# Patient Record
Sex: Female | Born: 1990 | Race: Black or African American | Hispanic: No | Marital: Single | Smoking: Former smoker
Health system: Southern US, Community
[De-identification: ages and names within clinical notes are randomized; demographics above are authoritative.]

## PROBLEM LIST (undated history)

## (undated) ENCOUNTER — Inpatient Hospital Stay (HOSPITAL_COMMUNITY): Payer: Self-pay

## (undated) DIAGNOSIS — Z789 Other specified health status: Secondary | ICD-10-CM

## (undated) DIAGNOSIS — B9689 Other specified bacterial agents as the cause of diseases classified elsewhere: Secondary | ICD-10-CM

## (undated) DIAGNOSIS — N760 Acute vaginitis: Secondary | ICD-10-CM

## (undated) HISTORY — PX: NO PAST SURGERIES: SHX2092

---

## 2010-12-11 ENCOUNTER — Emergency Department (HOSPITAL_COMMUNITY)
Admission: EM | Admit: 2010-12-11 | Discharge: 2010-12-12 | Disposition: A | Discharge status: Home / Self Care | Payer: Medicaid Other | Attending: Emergency Medicine | Admitting: Emergency Medicine

## 2010-12-11 DIAGNOSIS — A499 Bacterial infection, unspecified: Secondary | ICD-10-CM | POA: Insufficient documentation

## 2010-12-11 DIAGNOSIS — K5900 Constipation, unspecified: Secondary | ICD-10-CM | POA: Insufficient documentation

## 2010-12-11 DIAGNOSIS — N760 Acute vaginitis: Secondary | ICD-10-CM | POA: Insufficient documentation

## 2010-12-11 DIAGNOSIS — B9689 Other specified bacterial agents as the cause of diseases classified elsewhere: Secondary | ICD-10-CM | POA: Insufficient documentation

## 2010-12-11 DIAGNOSIS — R110 Nausea: Secondary | ICD-10-CM | POA: Insufficient documentation

## 2010-12-12 LAB — URINALYSIS, ROUTINE W REFLEX MICROSCOPIC
Nitrite: NEGATIVE
Protein, ur: NEGATIVE mg/dL
Specific Gravity, Urine: 1.015 (ref 1.005–1.030)
Urobilinogen, UA: 1 mg/dL (ref 0.0–1.0)

## 2010-12-12 LAB — PREGNANCY, URINE: Preg Test, Ur: NEGATIVE

## 2010-12-12 LAB — WET PREP, GENITAL: Trich, Wet Prep: NONE SEEN

## 2010-12-13 LAB — GC/CHLAMYDIA PROBE AMP, GENITAL: GC Probe Amp, Genital: NEGATIVE

## 2012-10-24 NOTE — L&D Delivery Note (Signed)
Delivery Note At 3:31 AM a viable female was delivered via Vaginal, Spontaneous Delivery (Presentation: Right Occiput Anterior).  APGAR: 6, 9; weight pending.   Placenta status: Intact, Spontaneous.  Cord: 3 vessels with the following complications: None.  Cord pH: Not drawn.  Anesthesia: Epidural  Episiotomy: None Lacerations: 1st degree;Vaginal Suture Repair: 3.0 Monocryl, one anchor and one running stitch Est. Blood Loss (mL):   Mom to postpartum.  Baby to Couplet care / Skin to Skin.  Foley bulb reported out around 0230 and RN checked pt to be around 5.5 cm dilated at that time. Dr. Casper Harrison checked patient around 0315, cervix 9.5 / 100 / +1 with plan to labor down for about 45 minutes and return. Called back to room in approximately 5 minutes for continued pressure and nurse check with baby starting to crown. Mother pushed through approximately 4 contractions and delivered easily over perineum with 1 small 1st degree tear requiring one stitch as above with good hemostasis. Baby alert with vigorous cry. Mother planning to breastfeed and wants Mirena.  Roney Marion was present for and assisted with the delivery.  Bobbye Morton, MD PGY-2, [facility] 09/16/2013, 3:55 AM

## 2012-10-24 NOTE — L&D Delivery Note (Signed)
I examined pt and agree with documentation above and resident plan of care. MUHAMMAD,WALIDAH  

## 2012-11-11 ENCOUNTER — Inpatient Hospital Stay (HOSPITAL_COMMUNITY)
Admission: AD | Admit: 2012-11-11 | Discharge: 2012-11-11 | Disposition: A | Discharge status: Home / Self Care | Payer: Self-pay | Source: Ambulatory Visit | Attending: Obstetrics and Gynecology | Admitting: Obstetrics and Gynecology

## 2012-11-11 ENCOUNTER — Encounter (HOSPITAL_COMMUNITY): Payer: Self-pay

## 2012-11-11 DIAGNOSIS — B9689 Other specified bacterial agents as the cause of diseases classified elsewhere: Secondary | ICD-10-CM | POA: Insufficient documentation

## 2012-11-11 DIAGNOSIS — N949 Unspecified condition associated with female genital organs and menstrual cycle: Secondary | ICD-10-CM | POA: Insufficient documentation

## 2012-11-11 DIAGNOSIS — N760 Acute vaginitis: Secondary | ICD-10-CM | POA: Insufficient documentation

## 2012-11-11 DIAGNOSIS — A499 Bacterial infection, unspecified: Secondary | ICD-10-CM | POA: Insufficient documentation

## 2012-11-11 DIAGNOSIS — R1031 Right lower quadrant pain: Secondary | ICD-10-CM | POA: Insufficient documentation

## 2012-11-11 HISTORY — DX: Other specified health status: Z78.9

## 2012-11-11 LAB — URINALYSIS, ROUTINE W REFLEX MICROSCOPIC
Bilirubin Urine: NEGATIVE
Glucose, UA: NEGATIVE mg/dL
Hgb urine dipstick: NEGATIVE
Protein, ur: NEGATIVE mg/dL
Urobilinogen, UA: 0.2 mg/dL (ref 0.0–1.0)

## 2012-11-11 LAB — WET PREP, GENITAL: Yeast Wet Prep HPF POC: NONE SEEN

## 2012-11-11 LAB — POCT PREGNANCY, URINE: Preg Test, Ur: NEGATIVE

## 2012-11-11 MED ORDER — METRONIDAZOLE 500 MG PO TABS: 500.0000 mg | ORAL_TABLET | Freq: Two times a day (BID) | ORAL | Status: DC

## 2012-11-11 NOTE — MAU Provider Note (Signed)
  History     CSN: [id number]  Arrival date and time: 11/11/12 3474   First Provider Initiated Contact with Patient 11/11/12 2010      Chief Complaint  Patient presents with  . Vaginal Discharge  . Dysuria   HPI Ms. Rebecca Leach is a 22 y.o. .G0P0 female who presents to MAU with complaint of vaginal discharge. The patient states that she has had a milky white discharge x 3 weeks. She has had some intermittent pain that at its worst she rates at 5/10. Today she is not having any pain. When it is present it is in the RLQ. The patient is currently sexually active with one partner, a new partner. She does not use condoms or any type of birth control. The patient denies fever, N/V/D.   OB History    Grav Para Term Preterm Abortions TAB SAB Ect Mult Living   0 0              Past Medical History  Diagnosis Date  . No pertinent past medical history     Past Surgical History  Procedure Date  . No past surgeries     History reviewed. No pertinent family history.  History  Substance Use Topics  . Smoking status: Current Every Day Smoker  . Smokeless tobacco: Not on file  . Alcohol Use: Yes    Allergies:  Allergies  Allergen Reactions  . Other     mushrooms    Prescriptions prior to admission  Medication Sig Dispense Refill  . Ascorbic Acid (VITAMIN C) 100 MG tablet Take 100 mg by mouth daily.      . cholecalciferol (VITAMIN D) 1000 UNITS tablet Take 1,000 Units by mouth daily.        ROS All negative unless otherwise noted in HPI Physical Exam   Blood pressure 135/78, pulse 77, temperature 98.8 F (37.1 C), temperature source Oral, resp. rate 16, last menstrual period 10/17/2012, SpO2 100.00%.  Physical Exam  Constitutional: She is oriented to person, place, and time. She appears well-developed and well-nourished. No distress.  HENT:  Head: Normocephalic and atraumatic.  Cardiovascular: Normal rate, regular rhythm and normal heart sounds.   Respiratory:  Effort normal and breath sounds normal. No respiratory distress.  GI: Soft. Bowel sounds are normal. She exhibits no distension and no mass. There is no tenderness. There is no rebound and no guarding.  Genitourinary: Uterus normal. Right adnexum displays no mass and no tenderness. Left adnexum displays no mass and no tenderness. No erythema, tenderness or bleeding around the vagina. Vaginal discharge (thin white discharge noted on speculum exam) found.  Neurological: She is alert and oriented to person, place, and time.  Skin: Skin is warm and dry. No erythema.  Psychiatric: She has a normal mood and affect.    MAU Course  Procedures None  MDM UA, Wet prep and GC/Chlamydia performed  Assessment and Plan  A: Bacterial vaginosis  P: Discharge home Rx for Flagyl given to patient Discussed appropriate feminine hygiene and probiotics Recommended patient call [facility] to establish GYN care and start birth control Recommended patient use condoms Patient may follow-up in MAU as needed  Freddi Starr, PA-C 11/11/2012, 8:21 PM

## 2012-11-11 NOTE — MAU Note (Signed)
RLQ pain with urination. Vaginal discharge, white, malodorous. Denies vaginal bleeding. LMP 10/17/12.

## 2012-11-12 LAB — GC/CHLAMYDIA PROBE AMP: CT Probe RNA: NEGATIVE

## 2012-11-12 NOTE — MAU Provider Note (Signed)
Attestation of Attending Supervision of Advanced Practitioner: Evaluation and management procedures were performed by the PA/NP/CNM/OB Fellow under my supervision/collaboration. Chart reviewed and agree with management and plan.  Haskel Dewalt V 11/12/2012 10:23 PM

## 2013-01-22 ENCOUNTER — Emergency Department (HOSPITAL_COMMUNITY)
Admission: EM | Admit: 2013-01-22 | Discharge: 2013-01-23 | Disposition: A | Discharge status: Home / Self Care | Payer: Self-pay | Attending: Emergency Medicine | Admitting: Emergency Medicine

## 2013-01-22 ENCOUNTER — Emergency Department (HOSPITAL_COMMUNITY): Payer: Self-pay

## 2013-01-22 ENCOUNTER — Encounter (HOSPITAL_COMMUNITY): Payer: Self-pay

## 2013-01-22 DIAGNOSIS — O9989 Other specified diseases and conditions complicating pregnancy, childbirth and the puerperium: Secondary | ICD-10-CM | POA: Insufficient documentation

## 2013-01-22 DIAGNOSIS — O99330 Smoking (tobacco) complicating pregnancy, unspecified trimester: Secondary | ICD-10-CM | POA: Insufficient documentation

## 2013-01-22 DIAGNOSIS — N949 Unspecified condition associated with female genital organs and menstrual cycle: Secondary | ICD-10-CM | POA: Insufficient documentation

## 2013-01-22 DIAGNOSIS — Z8742 Personal history of other diseases of the female genital tract: Secondary | ICD-10-CM | POA: Insufficient documentation

## 2013-01-22 DIAGNOSIS — Z3201 Encounter for pregnancy test, result positive: Secondary | ICD-10-CM | POA: Insufficient documentation

## 2013-01-22 DIAGNOSIS — R1031 Right lower quadrant pain: Secondary | ICD-10-CM | POA: Insufficient documentation

## 2013-01-22 HISTORY — DX: Other specified bacterial agents as the cause of diseases classified elsewhere: B96.89

## 2013-01-22 HISTORY — DX: Acute vaginitis: N76.0

## 2013-01-22 LAB — POCT I-STAT, CHEM 8
Calcium, Ion: 1.2 mmol/L (ref 1.12–1.23)
Creatinine, Ser: 0.6 mg/dL (ref 0.50–1.10)
Glucose, Bld: 87 mg/dL (ref 70–99)
HCT: 38 % (ref 36.0–46.0)
Hemoglobin: 12.9 g/dL (ref 12.0–15.0)
Potassium: 3.7 mEq/L (ref 3.5–5.1)
TCO2: 25 mmol/L (ref 0–100)

## 2013-01-22 LAB — CBC
HCT: 34.9 % — ABNORMAL LOW (ref 36.0–46.0)
Hemoglobin: 12.2 g/dL (ref 12.0–15.0)
MCH: 30.7 pg (ref 26.0–34.0)
MCHC: 35 g/dL (ref 30.0–36.0)
RDW: 11.7 % (ref 11.5–15.5)

## 2013-01-22 LAB — HCG, QUANTITATIVE, PREGNANCY: hCG, Beta Chain, Quant, S: 1013 m[IU]/mL — ABNORMAL HIGH (ref <5)

## 2013-01-22 MED ORDER — SODIUM CHLORIDE 0.9 % IV BOLUS (SEPSIS)
250.0000 mL | Freq: Once | INTRAVENOUS | Status: AC
  Administered 2013-01-22: 250 mL via INTRAVENOUS

## 2013-01-22 NOTE — ED Notes (Signed)
Per pt, tested positive at home Sunday for pregnancy.  Last period was at end of Feb.  Pt now states now having rt lower abdomen, constant.  No n/v.  No difficulty urinating. No fever.  No physical trauma or lifting injury noted.

## 2013-01-22 NOTE — ED Notes (Signed)
US at bedside

## 2013-01-22 NOTE — ED Provider Notes (Signed)
History     CSN: [id number]  Arrival date & time 01/22/13  2035   First MD Initiated Contact with Patient 01/22/13 2247      Chief Complaint  Patient presents with  . Abdominal Pain    (Consider location/radiation/quality/duration/timing/severity/associated sxs/prior treatment) HPI Comments: Patient with + home pregnancy test LMP 12/18/12  Now having RLQ pain .  Denies vaginal bleeding, discharge, dysuria   Patient is a 22 y.o. female presenting with abdominal pain. The history is provided by the patient.  Abdominal Pain Pain location:  RLQ Pain quality: cramping   Pain radiates to:  Does not radiate Pain severity:  Moderate Onset quality:  Gradual Duration:  1 day Timing:  Constant Progression:  Worsening Chronicity:  New Relieved by:  Nothing Worsened by:  Nothing tried Associated symptoms: no dysuria, no fever, no nausea, no vaginal bleeding, no vaginal discharge and no vomiting     Past Medical History  Diagnosis Date  . No pertinent past medical history   . Bacterial vaginitis     Past Surgical History  Procedure Laterality Date  . No past surgeries      No family history on file.  History  Substance Use Topics  . Smoking status: Current Every Day Smoker  . Smokeless tobacco: Not on file  . Alcohol Use: Yes     Comment: social    OB History   Grav Para Term Preterm Abortions TAB SAB Ect Mult Living   0 0              Review of Systems  Constitutional: Negative for fever.  Gastrointestinal: Positive for abdominal pain. Negative for nausea and vomiting.  Genitourinary: Positive for pelvic pain. Negative for dysuria, decreased urine volume, vaginal bleeding, vaginal discharge and vaginal pain.  All other systems reviewed and are negative.    Allergies  Other  Home Medications   Current Outpatient Rx  Name  Route  Sig  Dispense  Refill  . ibuprofen (ADVIL,MOTRIN) 200 MG tablet   Oral   Take 200 mg by mouth every 6 (six) hours as needed for  pain.           BP 104/65  Pulse 69  Temp(Src) 98 F (36.7 C) (Oral)  Resp 18  SpO2 97%  LMP 12/16/2012  Physical Exam  Nursing note and vitals reviewed. Constitutional: She appears well-developed and well-nourished.  HENT:  Head: Normocephalic.  Eyes: Pupils are equal, round, and reactive to light.  Neck: Normal range of motion.  Cardiovascular: Normal rate.   Pulmonary/Chest: Effort normal.  Musculoskeletal: Normal range of motion.  Neurological: She is alert.  Skin: Skin is warm.    ED Course  Procedures (including critical care time)  Labs Reviewed  HCG, QUANTITATIVE, PREGNANCY - Abnormal; Notable for the following:    hCG, Beta Chain, Quant, S 1013 (*)    All other components within normal limits  CBC - Abnormal; Notable for the following:    HCT 34.9 (*)    All other components within normal limits  POCT PREGNANCY, URINE - Abnormal; Notable for the following:    Preg Test, Ur POSITIVE (*)    All other components within normal limits  WET PREP, GENITAL  GC/CHLAMYDIA PROBE AMP  POCT I-STAT, CHEM 8   US Ob Comp Less 14 Wks  01/23/2013  *RADIOLOGY REPORT*  Clinical Data: Right lower quadrant pain  OBSTETRIC <14 WK [place] AND TRANSVAGINAL OB US  Technique:  Both transabdominal and transvaginal ultrasound examinations  were performed for complete evaluation of the gestation as well as the maternal uterus, adnexal regions, and pelvic cul-de-sac.  Transvaginal technique was performed to assess early pregnancy.  Comparison:  None.  Intrauterine gestational sac:  There is a small irregular endometrial fluid collection with a mean sac diameter of 3.9 millimeters.  It is difficult to determine if this is a true gestational sac or just endometrial fluid. Yolk sac: Absent. Embryo: Absent. Cardiac Activity: Not applicable Heart Rate: Not applicable the bpm  MSD: 3.9 mm  five w one d CRL:   mm   w   d             [place] EDC: 09/23/2013  Maternal uterus/adnexae: There is a cystic lesion  within the right ovary measuring 1.2 x 1.1 x 1.6 cm with some internal echogenic signal and is likely a benign finding such as a hemorrhagic cyst.  Within the left adnexa, there is a large 6.1 x 3.8 x 3.7 cm heterogeneous mass associated with the left ovary.  Trace free fluid is noted.  IMPRESSION: A small endometrial fluid collection is present however this cannot be confirmed as a normal gestational sac.  It is somewhat irregular in shape and there is no obvious decidual ring.  There is also noted to be a large complex and vascular mass within the left adnexa.  These findings raise the possibility of ectopic pregnancy but there is no ectopic visualized gestational sac, no ectopic embryo, or node tubal ring.  Differential diagnosis still includes early pregnancy, spontaneous abortion, and ectopic pregnancy.  If the left adnexal mass is not associated with decidual tissue, it may represent a large ovarian mass such as a dermoid tumor.  Serial beta HCG levels are recommended.  Follow-up ultrasound in 1 week is also recommended to ensure development of an embryo.   Original Report Authenticated By: Jolaine Click, M.D.    US Ob Transvaginal  01/23/2013  *RADIOLOGY REPORT*  Clinical Data: Right lower quadrant pain  OBSTETRIC <14 WK [place] AND TRANSVAGINAL OB US  Technique:  Both transabdominal and transvaginal ultrasound examinations were performed for complete evaluation of the gestation as well as the maternal uterus, adnexal regions, and pelvic cul-de-sac.  Transvaginal technique was performed to assess early pregnancy.  Comparison:  None.  Intrauterine gestational sac:  There is a small irregular endometrial fluid collection with a mean sac diameter of 3.9 millimeters.  It is difficult to determine if this is a true gestational sac or just endometrial fluid. Yolk sac: Absent. Embryo: Absent. Cardiac Activity: Not applicable Heart Rate: Not applicable the bpm  MSD: 3.9 mm  five w one d CRL:   mm   w   d             [place]  EDC: 09/23/2013  Maternal uterus/adnexae: There is a cystic lesion within the right ovary measuring 1.2 x 1.1 x 1.6 cm with some internal echogenic signal and is likely a benign finding such as a hemorrhagic cyst.  Within the left adnexa, there is a large 6.1 x 3.8 x 3.7 cm heterogeneous mass associated with the left ovary.  Trace free fluid is noted.  IMPRESSION: A small endometrial fluid collection is present however this cannot be confirmed as a normal gestational sac.  It is somewhat irregular in shape and there is no obvious decidual ring.  There is also noted to be a large complex and vascular mass within the left adnexa.  These findings raise the possibility of  ectopic pregnancy but there is no ectopic visualized gestational sac, no ectopic embryo, or node tubal ring.  Differential diagnosis still includes early pregnancy, spontaneous abortion, and ectopic pregnancy.  If the left adnexal mass is not associated with decidual tissue, it may represent a large ovarian mass such as a dermoid tumor.  Serial beta HCG levels are recommended.  Follow-up ultrasound in 1 week is also recommended to ensure development of an embryo.   Original Report Authenticated By: Jolaine Click, M.D.      1. Pregnancy with abdominal pain of right lower quadrant, antepartum       MDM   Ultrasound is inconclusive as to intrauterine pregnancy versus ovarian cyst.  Quantitative beta was drawn in his low 1003.  This will need to be redrawn in 2 days.  Recommended.  The patient.  Followup at [facility] hospital        Arman Filter, NP 01/23/13 [phone]

## 2013-01-23 LAB — GC/CHLAMYDIA PROBE AMP: GC Probe RNA: NEGATIVE

## 2013-01-23 MED ORDER — CEFTRIAXONE SODIUM 250 MG IJ SOLR
250.0000 mg | Freq: Once | INTRAMUSCULAR | Status: AC
  Administered 2013-01-23: 250 mg via INTRAMUSCULAR
  Filled 2013-01-23: qty 250

## 2013-01-23 MED ORDER — AZITHROMYCIN 250 MG PO TABS
1000.0000 mg | ORAL_TABLET | Freq: Once | ORAL | Status: AC
  Administered 2013-01-23: 1000 mg via ORAL
  Filled 2013-01-23: qty 4

## 2013-01-24 ENCOUNTER — Inpatient Hospital Stay (HOSPITAL_COMMUNITY): Payer: Self-pay

## 2013-01-24 ENCOUNTER — Encounter (HOSPITAL_COMMUNITY): Payer: Self-pay

## 2013-01-24 ENCOUNTER — Inpatient Hospital Stay (HOSPITAL_COMMUNITY)
Admission: AD | Admit: 2013-01-24 | Discharge: 2013-01-24 | Disposition: A | Discharge status: Home / Self Care | Payer: Self-pay | Source: Ambulatory Visit | Attending: Obstetrics & Gynecology | Admitting: Obstetrics & Gynecology

## 2013-01-24 DIAGNOSIS — D279 Benign neoplasm of unspecified ovary: Secondary | ICD-10-CM | POA: Insufficient documentation

## 2013-01-24 DIAGNOSIS — O26899 Other specified pregnancy related conditions, unspecified trimester: Secondary | ICD-10-CM

## 2013-01-24 DIAGNOSIS — D271 Benign neoplasm of left ovary: Secondary | ICD-10-CM

## 2013-01-24 DIAGNOSIS — R109 Unspecified abdominal pain: Secondary | ICD-10-CM | POA: Insufficient documentation

## 2013-01-24 DIAGNOSIS — O99891 Other specified diseases and conditions complicating pregnancy: Secondary | ICD-10-CM | POA: Insufficient documentation

## 2013-01-24 DIAGNOSIS — O34599 Maternal care for other abnormalities of gravid uterus, unspecified trimester: Secondary | ICD-10-CM | POA: Insufficient documentation

## 2013-01-24 LAB — HCG, QUANTITATIVE, PREGNANCY: hCG, Beta Chain, Quant, S: 2033 m[IU]/mL — ABNORMAL HIGH (ref <5)

## 2013-01-24 NOTE — MAU Provider Note (Signed)
Attestation of Attending Supervision of Advanced Practitioner (CNM/NP): Evaluation and management procedures were performed by the Advanced Practitioner under my supervision and collaboration.  I have reviewed the Advanced Practitioner's note and chart, and I agree with the management and plan.  HARRAWAY-SMITH, Xareni Kelch 8:48 PM     

## 2013-01-24 NOTE — MAU Note (Signed)
Patient states she was seen at [facility] ED and informed to return to MAU today for follow up BHCG. Patient states she has intermittent right lower abdominal pain about every 2 hours. Some nausea, no vomiting.  No bleeding.

## 2013-01-24 NOTE — MAU Provider Note (Signed)
  History     CSN: [id number]  Arrival date and time: 01/24/13 1320   None     Chief Complaint  Patient presents with  . Follow-up   HPI Rebecca Leach is 22 y.o. G1P0 [redacted]w[redacted]d weeks presents for follow up BHCG.  She was seen at [facility] on 4/1 late in the evening with pain in early pregnancy.  BHCG at that time was 1013.  U/S showed an irregular sac and a cystic mass on the ovary  Today she reports intermittent pain on the lower right occuring q2hrs.  Denies vaginal bleeding.    Past Medical History  Diagnosis Date  . No pertinent past medical history   . Bacterial vaginitis     Past Surgical History  Procedure Laterality Date  . No past surgeries      No family history on file.  History  Substance Use Topics  . Smoking status: Current Every Day Smoker  . Smokeless tobacco: Not on file  . Alcohol Use: Yes     Comment: social    Allergies:  Allergies  Allergen Reactions  . Other Hives    mushrooms    Prescriptions prior to admission  Medication Sig Dispense Refill  . ibuprofen (ADVIL,MOTRIN) 200 MG tablet Take 200 mg by mouth every 6 (six) hours as needed for pain.        ROS Physical Exam   Blood pressure 112/59, pulse 73, resp. rate 16, height 5\' 8"  (1.727 m), weight 143 lb (64.864 kg), last menstrual period 12/16/2012, SpO2 100.00%.  Physical Exam    Results for orders placed during the hospital encounter of 01/24/13 (from the past 24 hour(s))  HCG, QUANTITATIVE, PREGNANCY     Status: Abnormal   Collection Time    01/24/13  1:53 PM      Result Value Range   hCG, Beta Chain, Quant, S 2033 (*) <5 mIU/mL    MAU Course  Procedures  MDM 16:15  Reported MSE, previous visit and BHCG results to Dr. Erin Fulling.   Order given for repeat ultrasound with dopplers. 17;25  Reported ultrasound results to Dr. Erin Fulling.  Have patient re-evaluated in 1 week--in the clinic.  Instructed the patient if they are unable to see her in that time frame to return to  MAU for re-evaluation.  ALSO IF she has increase in pain or vaginal bleeding to return to MAU before that date for re-evaluation.  Discussed ectopic pregnancy sxs with the patient. Assessment and Plan  A:  Follow up BHCG--doubled      Probably IUGS no YS or FP     Right sided pain-intermittent      Probably dermoid cyst on the left  P: The gyn clinic will call patient for appt for next week     Patient understands if unable to get appt, she is to return to MAU for follow up of abdominal pain     Return sooner for increase in sxs.    Darrell Hauk,EVE M 01/24/2013, 4:12 PM

## 2013-01-24 NOTE — ED Provider Notes (Signed)
Medical screening examination/treatment/procedure(s) were performed by non-physician practitioner and as supervising physician I was immediately available for consultation/collaboration.  Patient will need follow up for possible ectopic or threatened miscarriage. She does have some RLQ pain but suspsious finding on [place] is on the left. IUP not confirmed but Quants are low but over 1000. Stable for discharge and close follow up.  Shelda Jakes, MD 01/24/13 [phone]

## 2013-03-12 ENCOUNTER — Inpatient Hospital Stay (HOSPITAL_COMMUNITY): Payer: Medicaid Other

## 2013-03-12 ENCOUNTER — Encounter (HOSPITAL_COMMUNITY): Payer: Self-pay

## 2013-03-12 ENCOUNTER — Inpatient Hospital Stay (HOSPITAL_COMMUNITY)
Admission: AD | Admit: 2013-03-12 | Discharge: 2013-03-12 | Disposition: A | Discharge status: Home Health | Payer: Medicaid Other | Source: Ambulatory Visit | Attending: Obstetrics and Gynecology | Admitting: Obstetrics and Gynecology

## 2013-03-12 DIAGNOSIS — O26859 Spotting complicating pregnancy, unspecified trimester: Secondary | ICD-10-CM | POA: Insufficient documentation

## 2013-03-12 DIAGNOSIS — R109 Unspecified abdominal pain: Secondary | ICD-10-CM | POA: Insufficient documentation

## 2013-03-12 DIAGNOSIS — O209 Hemorrhage in early pregnancy, unspecified: Secondary | ICD-10-CM

## 2013-03-12 DIAGNOSIS — O4690 Antepartum hemorrhage, unspecified, unspecified trimester: Secondary | ICD-10-CM

## 2013-03-12 LAB — CBC
Hemoglobin: 11.2 g/dL — ABNORMAL LOW (ref 12.0–15.0)
MCH: 30.8 pg (ref 26.0–34.0)
RBC: 3.64 MIL/uL — ABNORMAL LOW (ref 3.87–5.11)

## 2013-03-12 LAB — URINALYSIS, ROUTINE W REFLEX MICROSCOPIC
Glucose, UA: NEGATIVE mg/dL
Ketones, ur: NEGATIVE mg/dL
Leukocytes, UA: NEGATIVE
pH: 7.5 (ref 5.0–8.0)

## 2013-03-12 LAB — URINE MICROSCOPIC-ADD ON

## 2013-03-12 LAB — HCG, QUANTITATIVE, PREGNANCY: hCG, Beta Chain, Quant, S: 41202 m[IU]/mL — ABNORMAL HIGH (ref <5)

## 2013-03-12 LAB — ABO/RH: ABO/RH(D): O POS

## 2013-03-12 NOTE — MAU Note (Signed)
Pt states began spotting today around 1500, now having lower abd and back cramping. Drinks 10 glasses of water daily. Denies uti s/s. White odorous discharge, thin.

## 2013-03-12 NOTE — MAU Provider Note (Signed)
  History     CSN: [id number]  Arrival date and time: 03/12/13 1603   None     Chief Complaint  Patient presents with  . Abdominal Cramping  . Spotting    HPI Pt is G1P0 presenting with spotting in pregnancy.  Pt was seen initially on 02/23/2013 and had [place] with IUGS [redacted]w[redacted]d which would make her 7w 1d. by previous U/S - by LMP should be [redacted]w[redacted]d. Pt states that she had IC last night and started spotting a little dark brown discharge today.  Pt is not having cramping or UTI symptoms.  Past Medical History  Diagnosis Date  . No pertinent past medical history   . Bacterial vaginitis     Past Surgical History  Procedure Laterality Date  . No past surgeries      No family history on file.  History  Substance Use Topics  . Smoking status: Current Every Day Smoker  . Smokeless tobacco: Not on file  . Alcohol Use: Yes     Comment: social    Allergies:  Allergies  Allergen Reactions  . Other Hives    mushrooms    No prescriptions prior to admission    Review of Systems  Gastrointestinal: Negative for nausea, vomiting, abdominal pain, diarrhea and constipation.  Genitourinary: Negative for dysuria and urgency.   Physical Exam   Last menstrual period 12/16/2012.  Physical Exam  Vitals reviewed. Constitutional: She is oriented to person, place, and time. She appears well-developed and well-nourished.  HENT:  Head: Normocephalic.  Eyes: Pupils are equal, round, and reactive to light.  Neck: Normal range of motion.  Cardiovascular: Normal rate.   Respiratory: Effort normal.  GI: Soft. She exhibits no distension. There is no tenderness. There is no rebound.  Genitourinary:  Small amount of beige colored creamy discharge in vault; cervix deep in corner of fornix unable to visualize with speculum and difficult to palpate.  Uterus enlarged 12 week size, NT; adnexa without palpable enlargement or tenderness  Musculoskeletal: Normal range of motion.  Neurological: She is alert  and oriented to person, place, and time.  Skin: Skin is warm and dry.  Psychiatric: She has a normal mood and affect.    MAU Course  Procedures   Assessment and Plan  Bleeding in pregnancy Single living IUP F/u for OB care-pt desires to go to [facility] clinic  [facility] 03/12/2013, 5:24 PM

## 2013-03-12 NOTE — MAU Provider Note (Signed)
Chart reviewed and agree with management and plan.  

## 2013-04-02 ENCOUNTER — Encounter (HOSPITAL_COMMUNITY): Payer: Self-pay | Admitting: *Deleted

## 2013-04-02 ENCOUNTER — Inpatient Hospital Stay (HOSPITAL_COMMUNITY)
Admission: AD | Admit: 2013-04-02 | Discharge: 2013-04-02 | Disposition: A | Discharge status: Home / Self Care | Payer: Medicaid Other | Source: Ambulatory Visit | Attending: Obstetrics & Gynecology | Admitting: Obstetrics & Gynecology

## 2013-04-02 DIAGNOSIS — O9989 Other specified diseases and conditions complicating pregnancy, childbirth and the puerperium: Secondary | ICD-10-CM

## 2013-04-02 DIAGNOSIS — O26899 Other specified pregnancy related conditions, unspecified trimester: Secondary | ICD-10-CM

## 2013-04-02 DIAGNOSIS — O99891 Other specified diseases and conditions complicating pregnancy: Secondary | ICD-10-CM | POA: Insufficient documentation

## 2013-04-02 DIAGNOSIS — N949 Unspecified condition associated with female genital organs and menstrual cycle: Secondary | ICD-10-CM

## 2013-04-02 DIAGNOSIS — K5900 Constipation, unspecified: Secondary | ICD-10-CM | POA: Insufficient documentation

## 2013-04-02 DIAGNOSIS — R1031 Right lower quadrant pain: Secondary | ICD-10-CM | POA: Insufficient documentation

## 2013-04-02 LAB — URINALYSIS, ROUTINE W REFLEX MICROSCOPIC
Ketones, ur: NEGATIVE mg/dL
Leukocytes, UA: NEGATIVE
Nitrite: NEGATIVE
Protein, ur: NEGATIVE mg/dL

## 2013-04-02 NOTE — MAU Provider Note (Signed)
History     CSN: [id number]  Arrival date and time: 04/02/13 1436   First Provider Initiated Contact with Patient 04/02/13 1550      Chief Complaint  Patient presents with  . Abdominal Pain   HPI 22 y.o. G1P0 at [redacted]w[redacted]d with RLQ pain starting at about 1230 today. Sharp and quick, lasts for a few seconds. No bleeding or discharge. + constipation. No n/v/d, no fever.   Past Medical History  Diagnosis Date  . No pertinent past medical history   . Bacterial vaginitis     Past Surgical History  Procedure Laterality Date  . No past surgeries      History reviewed. No pertinent family history.  History  Substance Use Topics  . Smoking status: Former Smoker -- 0.25 packs/day    Quit date: 12/31/2012  . Smokeless tobacco: Not on file  . Alcohol Use: No     Comment: social    Allergies:  Allergies  Allergen Reactions  . Other Hives    mushrooms    No prescriptions prior to admission    Review of Systems  Constitutional: Negative.   Respiratory: Negative.   Cardiovascular: Negative.   Gastrointestinal: Positive for abdominal pain and constipation. Negative for nausea, vomiting and diarrhea.  Genitourinary: Negative for dysuria, urgency, frequency, hematuria and flank pain.       Negative for vaginal bleeding, vaginal discharge, dyspareunia  Musculoskeletal: Negative.   Neurological: Negative.   Psychiatric/Behavioral: Negative.    Physical Exam   Blood pressure 106/63, pulse 82, temperature 97.6 F (36.4 C), temperature source Oral, resp. rate 16, height 5\' 7"  (1.702 m), weight 148 lb 12.8 oz (67.495 kg), last menstrual period 12/16/2012, SpO2 100.00%.  Physical Exam  Nursing note and vitals reviewed. Constitutional: She is oriented to person, place, and time. She appears well-developed and well-nourished. No distress.  Cardiovascular: Normal rate.   Respiratory: Effort normal.  GI: Soft. She exhibits no distension (c/w dates) and no mass. There is tenderness  (RLQ/inguinal area, mild). There is no rebound and no guarding.  Musculoskeletal: Normal range of motion.  Neurological: She is alert and oriented to person, place, and time.  Skin: Skin is warm and dry.  Psychiatric: She has a normal mood and affect.    MAU Course  Procedures Results for orders placed during the hospital encounter of 04/02/13 (from the past 24 hour(s))  URINALYSIS, ROUTINE W REFLEX MICROSCOPIC     Status: None   Collection Time    04/02/13  3:05 PM      Result Value Range   Color, Urine YELLOW  YELLOW   APPearance CLEAR  CLEAR   Specific Gravity, Urine 1.020  1.005 - 1.030   pH 6.0  5.0 - 8.0   Glucose, UA NEGATIVE  NEGATIVE mg/dL   Hgb urine dipstick NEGATIVE  NEGATIVE   Bilirubin Urine NEGATIVE  NEGATIVE   Ketones, ur NEGATIVE  NEGATIVE mg/dL   Protein, ur NEGATIVE  NEGATIVE mg/dL   Urobilinogen, UA 0.2  0.0 - 1.0 mg/dL   Nitrite NEGATIVE  NEGATIVE   Leukocytes, UA NEGATIVE  NEGATIVE     Assessment and Plan   1. Pain of round ligament complicating pregnancy, antepartum   Rev'd comfort measures Pt c/o constipation since starting PNV with iron - recommend PNV with no iron, stool softener PRN or miralax Has appointment next week in [facility]    Medication List    ASK your doctor about these medications       prenatal  multivitamin Tabs  Take 1 tablet by mouth daily at 12 noon.            Follow-up Information   Follow up with [facility]   Contact information:   [address] [place] [address] [phone]        Rebecca Leach 04/02/2013, 4:11 PM

## 2013-04-02 NOTE — MAU Provider Note (Signed)
Attestation of Attending Supervision of Advanced Practitioner (PA/CNM/NP): Evaluation and management procedures were performed by the Advanced Practitioner under my supervision and collaboration.  I have reviewed the Advanced Practitioner's note and chart, and I agree with the management and plan.  Analuisa Tudor, MD, FACOG Attending Obstetrician & Gynecologist Faculty Practice, [facility]  

## 2013-04-02 NOTE — MAU Note (Signed)
Rt lower abd pain which started today about 1230, sharp in nature.  Pain radiating to rt lower back area.  Denies vaginal bleeding or gush of fluid.

## 2013-04-12 ENCOUNTER — Encounter (HOSPITAL_COMMUNITY): Payer: Self-pay | Admitting: *Deleted

## 2013-04-12 ENCOUNTER — Inpatient Hospital Stay (HOSPITAL_COMMUNITY)
Admission: AD | Admit: 2013-04-12 | Discharge: 2013-04-12 | Disposition: A | Discharge status: Home / Self Care | Payer: Medicaid Other | Source: Ambulatory Visit | Attending: Obstetrics & Gynecology | Admitting: Obstetrics & Gynecology

## 2013-04-12 DIAGNOSIS — O228X9 Other venous complications in pregnancy, unspecified trimester: Secondary | ICD-10-CM | POA: Insufficient documentation

## 2013-04-12 DIAGNOSIS — K649 Unspecified hemorrhoids: Secondary | ICD-10-CM | POA: Insufficient documentation

## 2013-04-12 DIAGNOSIS — K5900 Constipation, unspecified: Secondary | ICD-10-CM | POA: Insufficient documentation

## 2013-04-12 DIAGNOSIS — O99891 Other specified diseases and conditions complicating pregnancy: Secondary | ICD-10-CM | POA: Insufficient documentation

## 2013-04-12 LAB — URINALYSIS, ROUTINE W REFLEX MICROSCOPIC
Bilirubin Urine: NEGATIVE
Ketones, ur: NEGATIVE mg/dL
Nitrite: NEGATIVE
Urobilinogen, UA: 0.2 mg/dL (ref 0.0–1.0)

## 2013-04-12 LAB — URINE MICROSCOPIC-ADD ON

## 2013-04-12 MED ORDER — HYDROCORTISONE ACE-PRAMOXINE 1-1 % RE FOAM
1.0000 | Freq: Two times a day (BID) | RECTAL | Status: DC
  Filled 2013-04-12: qty 10

## 2013-04-12 MED ORDER — HYDROCORTISONE ACE-PRAMOXINE 2.5-1 % RE CREA
TOPICAL_CREAM | Freq: Once | RECTAL | Status: DC
  Administered 2013-04-12: 17:00:00 via RECTAL

## 2013-04-12 NOTE — MAU Provider Note (Signed)
History     CSN: [id number]  Arrival date and time: 04/12/13 1414   None     Chief Complaint  Patient presents with  . Constipation  . Hematuria  . Nausea   HPI  Rebecca Leach is a 22 y.o. G1P0 at [redacted]w[redacted]d who presents today with constipation. She states that she has not had a BM in one week, and that she tried to go today. After a period of straining she was able to have a small BM, but then she saw blood in the toilet and blood when she wiped. She has an appointment on Wednesday with the clinic.   Past Medical History  Diagnosis Date  . No pertinent past medical history   . Bacterial vaginitis     Past Surgical History  Procedure Laterality Date  . No past surgeries      No family history on file.  History  Substance Use Topics  . Smoking status: Former Smoker -- 0.25 packs/day    Quit date: 12/31/2012  . Smokeless tobacco: Not on file  . Alcohol Use: No     Comment: social    Allergies:  Allergies  Allergen Reactions  . Other Hives    mushrooms    Prescriptions prior to admission  Medication Sig Dispense Refill  . Prenatal Vit-Fe Fumarate-FA (PRENATAL MULTIVITAMIN) TABS Take 1 tablet by mouth daily at 12 noon.        Review of Systems  Eyes: Negative for blurred vision.  Respiratory: Negative for shortness of breath.   Cardiovascular: Negative for chest pain.  Gastrointestinal: Positive for heartburn and blood in stool.  Musculoskeletal: Negative for myalgias.  Neurological: Positive for headaches. Negative for dizziness.   Physical Exam   Blood pressure 101/65, pulse 84, temperature 98.3 F (36.8 C), temperature source Oral, resp. rate 16, height 5\' 7"  (1.702 m), weight 67.314 kg (148 lb 6.4 oz), last menstrual period 12/16/2012, SpO2 100.00%.  Physical Exam  Nursing note and vitals reviewed. Constitutional: She appears well-developed and well-nourished. No distress.  Cardiovascular: Normal rate.   Respiratory: Effort normal.  GI: Soft.  There is no tenderness.  Genitourinary:  External: no lesion Vagina: small amount of white discharge Cervix: pink, smooth, no CMT Uterus: AGA, FHT 143 Adnexa: NT     MAU Course  Procedures  Results for orders placed during the hospital encounter of 04/12/13 (from the past 24 hour(s))  URINALYSIS, ROUTINE W REFLEX MICROSCOPIC     Status: Abnormal   Collection Time    04/12/13  4:00 PM      Result Value Range   Color, Urine YELLOW  YELLOW   APPearance CLEAR  CLEAR   Specific Gravity, Urine 1.020  1.005 - 1.030   pH 6.0  5.0 - 8.0   Glucose, UA NEGATIVE  NEGATIVE mg/dL   Hgb urine dipstick NEGATIVE  NEGATIVE   Bilirubin Urine NEGATIVE  NEGATIVE   Ketones, ur NEGATIVE  NEGATIVE mg/dL   Protein, ur NEGATIVE  NEGATIVE mg/dL   Urobilinogen, UA 0.2  0.0 - 1.0 mg/dL   Nitrite NEGATIVE  NEGATIVE   Leukocytes, UA TRACE (*) NEGATIVE  URINE MICROSCOPIC-ADD ON     Status: Abnormal   Collection Time    04/12/13  4:00 PM      Result Value Range   Squamous Epithelial / LPF FEW (*) RARE   WBC, UA 0-2  <3 WBC/hpf   Bacteria, UA RARE  RARE   1710: Patient reports results from enema, and is  feeling much better. However, her hemorrhoid is bothering her more now.    Assessment and Plan   1. Constipation   2. Hemorrhoid    RX: analpram TID Stop PNV with FE and take regular PNV Colace OTC Metamucil  2nd trimester danger signs reviewed FU with clinic as scheduled.   Tawnya Crook 04/12/2013, 4:09 PM

## 2013-04-12 NOTE — MAU Note (Signed)
Patient states she has not had a BM in one week and strained but was unable to have a BM and now has rectal pain. States she noted blood on the tissue after urinating. Nausea, no vomiting. Has an appointment at [facility] on 6-25.

## 2013-04-13 NOTE — MAU Provider Note (Signed)
Attestation of Attending Supervision of Advanced Practitioner (CNM/NP): Evaluation and management procedures were performed by the Advanced Practitioner under my supervision and collaboration. I have reviewed the Advanced Practitioner's note and chart, and I agree with the management and plan.  LEGGETT,KELLY H. 5:15 AM

## 2013-04-17 ENCOUNTER — Encounter: Payer: Self-pay | Admitting: Advanced Practice Midwife

## 2013-04-17 ENCOUNTER — Ambulatory Visit (INDEPENDENT_AMBULATORY_CARE_PROVIDER_SITE_OTHER): Payer: Self-pay | Admitting: Advanced Practice Midwife

## 2013-04-17 VITALS — BP 99/61 | Temp 98.8°F | Wt 147.7 lb

## 2013-04-17 DIAGNOSIS — O0930 Supervision of pregnancy with insufficient antenatal care, unspecified trimester: Secondary | ICD-10-CM

## 2013-04-17 DIAGNOSIS — O0932 Supervision of pregnancy with insufficient antenatal care, second trimester: Secondary | ICD-10-CM

## 2013-04-17 LAB — POCT URINALYSIS DIP (DEVICE)
Glucose, UA: NEGATIVE mg/dL
Leukocytes, UA: NEGATIVE
Nitrite: NEGATIVE
Specific Gravity, Urine: 1.02 (ref 1.005–1.030)
Urobilinogen, UA: 0.2 mg/dL (ref 0.0–1.0)

## 2013-04-17 NOTE — Progress Notes (Signed)
Pulse- 90 Patient reports lower back pain, problems with bowel movements, and reports d/c with foul odor

## 2013-04-17 NOTE — Addendum Note (Signed)
Addended by: Franchot Mimes on: 04/17/2013 11:00 AM   Modules accepted: Orders

## 2013-04-17 NOTE — Progress Notes (Signed)
   Subjective:    Rebecca Leach is a G1P0000 [redacted]w[redacted]d being seen today for her first obstetrical visit.  Her obstetrical history is significant for late to care. Patient does intend to breast feed. Pregnancy history fully reviewed.  Patient reports heartburn and no cramping.  Filed Vitals:   04/17/13 1018  BP: 99/61  Temp: 98.8 F (37.1 C)  Weight: 147 lb 11.2 oz (66.996 kg)    HISTORY: OB History   Grav Para Term Preterm Abortions TAB SAB Ect Mult Living   1 0 0 0 0 0 0 0 0 0      # Outc Date GA Lbr Len/2nd Wgt Sex Del Anes PTL Lv   1 CUR              Past Medical History  Diagnosis Date  . No pertinent past medical history   . Bacterial vaginitis    History reviewed. No pertinent past surgical history. Family History  Problem Relation Age of Onset  . Hypertension Mother   . Hypertension Maternal Grandmother      Exam    Uterus:     Pelvic Exam:    Perineum: No Hemorrhoids   Vulva: normal   Vagina:  normal mucosa   pH:    Cervix: no bleeding following Pap and nulliparous appearance   Adnexa: normal adnexa   Bony Pelvis: average  System: Breast:  normal appearance, no masses or tenderness   Skin: normal coloration and turgor, no rashes    Neurologic: oriented, normal mood   Extremities: normal strength, tone, and muscle mass, no deformities   HEENT PERRLA   Mouth/Teeth mucous membranes moist, pharynx normal without lesions   Neck supple   Cardiovascular: regular rate and rhythm   Respiratory:  appears well, vitals normal, no respiratory distress, acyanotic, normal RR, ear and throat exam is normal, neck free of mass or lymphadenopathy, chest clear, no wheezing, crepitations, rhonchi, normal symmetric air entry   Abdomen: soft, non-tender; bowel sounds normal; no masses,  no organomegaly   Urinary: urethral meatus normal      Assessment:    Pregnancy: G1P0000 There are no active problems to display for this patient.       Plan:     Initial labs  drawn. Prenatal vitamins. Problem list reviewed and updated. Genetic Screening discussed Quad Screen: requested.  Ultrasound discussed; fetal survey: requested.  Follow up in 4 weeks. 75% of 45 min visit spent on counseling and coordination of care.    Tawnya Crook 04/17/2013

## 2013-04-17 NOTE — Patient Instructions (Signed)
Pregnancy - Second Trimester The second trimester of pregnancy (3 to 6 months) is a period of rapid growth for you and your baby. At the end of the sixth month, your baby is about 9 inches long and weighs 1 1/2 pounds. You will begin to feel the baby move between 18 and 20 weeks of the pregnancy. This is called quickening. Weight gain is faster. A clear fluid (colostrum) may leak out of your breasts. You may feel small contractions of the womb (uterus). This is known as false labor or Braxton-Hicks contractions. This is like a practice for labor when the baby is ready to be born. Usually, the problems with morning sickness have usually passed by the end of your first trimester. Some women develop small dark blotches (called cholasma, mask of pregnancy) on their face that usually goes away after the baby is born. Exposure to the sun makes the blotches worse. Acne may also develop in some pregnant women and pregnant women who have acne, may find that it goes away. PRENATAL EXAMS  Blood work may continue to be done during prenatal exams. These tests are done to check on your health and the probable health of your baby. Blood work is used to follow your blood levels (hemoglobin). Anemia (low hemoglobin) is common during pregnancy. Iron and vitamins are given to help prevent this. You will also be checked for diabetes between 24 and 28 weeks of the pregnancy. Some of the previous blood tests may be repeated.  The size of the uterus is measured during each visit. This is to make sure that the baby is continuing to grow properly according to the dates of the pregnancy.  Your blood pressure is checked every prenatal visit. This is to make sure you are not getting toxemia.  Your urine is checked to make sure you do not have an infection, diabetes or protein in the urine.  Your weight is checked often to make sure gains are happening at the suggested rate. This is to ensure that both you and your baby are  growing normally.  Sometimes, an ultrasound is performed to confirm the proper growth and development of the baby. This is a test which bounces harmless sound waves off the baby so your caregiver can more accurately determine due dates. Sometimes, a test is done on the amniotic fluid surrounding the baby. This test is called an amniocentesis. The amniotic fluid is obtained by sticking a needle into the belly (abdomen). This is done to check the chromosomes in instances where there is a concern about possible genetic problems with the baby. It is also sometimes done near the end of pregnancy if an early delivery is required. In this case, it is done to help make sure the baby's lungs are mature enough for the baby to live outside of the womb. CHANGES OCCURING IN THE SECOND TRIMESTER OF PREGNANCY Your body goes through many changes during pregnancy. They vary from person to person. Talk to your caregiver about changes you notice that you are concerned about.  During the second trimester, you will likely have an increase in your appetite. It is normal to have cravings for certain foods. This varies from person to person and pregnancy to pregnancy.  Your lower abdomen will begin to bulge.  You may have to urinate more often because the uterus and baby are pressing on your bladder. It is also common to get more bladder infections during pregnancy. You can help this by drinking lots of fluids   and emptying your bladder before and after intercourse.  You may begin to get stretch marks on your hips, abdomen, and breasts. These are normal changes in the body during pregnancy. There are no exercises or medicines to take that prevent this change.  You may begin to develop swollen and bulging veins (varicose veins) in your legs. Wearing support hose, elevating your feet for 15 minutes, 3 to 4 times a day and limiting salt in your diet helps lessen the problem.  Heartburn may develop as the uterus grows and  pushes up against the stomach. Antacids recommended by your caregiver helps with this problem. Also, eating smaller meals 4 to 5 times a day helps.  Constipation can be treated with a stool softener or adding bulk to your diet. Drinking lots of fluids, and eating vegetables, fruits, and whole grains are helpful.  Exercising is also helpful. If you have been very active up until your pregnancy, most of these activities can be continued during your pregnancy. If you have been less active, it is helpful to start an exercise program such as walking.  Hemorrhoids may develop at the end of the second trimester. Warm sitz baths and hemorrhoid cream recommended by your caregiver helps hemorrhoid problems.  Backaches may develop during this time of your pregnancy. Avoid heavy lifting, wear low heal shoes, and practice good posture to help with backache problems.  Some pregnant women develop tingling and numbness of their hand and fingers because of swelling and tightening of ligaments in the wrist (carpel tunnel syndrome). This goes away after the baby is born.  As your breasts enlarge, you may have to get a bigger bra. Get a comfortable, cotton, support bra. Do not get a nursing bra until the last month of the pregnancy if you will be nursing the baby.  You may get a dark line from your belly button to the pubic area called the linea nigra.  You may develop rosy cheeks because of increase blood flow to the face.  You may develop spider looking lines of the face, neck, arms, and chest. These go away after the baby is born. HOME CARE INSTRUCTIONS   It is extremely important to avoid all smoking, herbs, alcohol, and unprescribed drugs during your pregnancy. These chemicals affect the formation and growth of the baby. Avoid these chemicals throughout the pregnancy to ensure the delivery of a healthy infant.  Most of your home care instructions are the same as suggested for the first trimester of your  pregnancy. Keep your caregiver's appointments. Follow your caregiver's instructions regarding medicine use, exercise, and diet.  During pregnancy, you are providing food for you and your baby. Continue to eat regular, well-balanced meals. Choose foods such as meat, fish, milk and other low fat dairy products, vegetables, fruits, and whole-grain breads and cereals. Your caregiver will tell you of the ideal weight gain.  A physical sexual relationship may be continued up until near the end of pregnancy if there are no other problems. Problems could include early (premature) leaking of amniotic fluid from the membranes, vaginal bleeding, abdominal pain, or other medical or pregnancy problems.  Exercise regularly if there are no restrictions. Check with your caregiver if you are unsure of the safety of some of your exercises. The greatest weight gain will occur in the last 2 trimesters of pregnancy. Exercise will help you:  Control your weight.  Get you in shape for labor and delivery.  Lose weight after you have the baby.  Wear   a good support or jogging bra for breast tenderness during pregnancy. This may help if worn during sleep. Pads or tissues may be used in the bra if you are leaking colostrum.  Do not use hot tubs, steam rooms or saunas throughout the pregnancy.  Wear your seat belt at all times when driving. This protects you and your baby if you are in an accident.  Avoid raw meat, uncooked cheese, cat litter boxes, and soil used by cats. These carry germs that can cause birth defects in the baby.  The second trimester is also a good time to visit your dentist for your dental health if this has not been done yet. Getting your teeth cleaned is okay. Use a soft toothbrush. Brush gently during pregnancy.  It is easier to leak urine during pregnancy. Tightening up and strengthening the pelvic muscles will help with this problem. Practice stopping your urination while you are going to the  bathroom. These are the same muscles you need to strengthen. It is also the muscles you would use as if you were trying to stop from passing gas. You can practice tightening these muscles up 10 times a set and repeating this about 3 times per day. Once you know what muscles to tighten up, do not perform these exercises during urination. It is more likely to contribute to an infection by backing up the urine.  Ask for help if you have financial, counseling, or nutritional needs during pregnancy. Your caregiver will be able to offer counseling for these needs as well as refer you for other special needs.  Your skin may become oily. If so, wash your face with mild soap, use non-greasy moisturizer and oil or cream based makeup. MEDICINES AND DRUG USE IN PREGNANCY  Take prenatal vitamins as directed. The vitamin should contain 1 milligram of folic acid. Keep all vitamins out of reach of children. Only a couple vitamins or tablets containing iron may be fatal to a baby or young child when ingested.  Avoid use of all medicines, including herbs, over-the-counter medicines, not prescribed or suggested by your caregiver. Only take over-the-counter or prescription medicines for pain, discomfort, or fever as directed by your caregiver. Do not use aspirin.  Let your caregiver also know about herbs you may be using.  Alcohol is related to a number of birth defects. This includes fetal alcohol syndrome. All alcohol, in any form, should be avoided completely. Smoking will cause low birth rate and premature babies.  Street or illegal drugs are very harmful to the baby. They are absolutely forbidden. A baby born to an addicted mother will be addicted at birth. The baby will go through the same withdrawal an adult does. SEEK MEDICAL CARE IF:  You have any concerns or worries during your pregnancy. It is better to call with your questions if you feel they cannot wait, rather than worry about them. SEEK IMMEDIATE  MEDICAL CARE IF:   An unexplained oral temperature above 102 F (38.9 C) develops, or as your caregiver suggests.  You have leaking of fluid from the vagina (birth canal). If leaking membranes are suspected, take your temperature and tell your caregiver of this when you call.  There is vaginal spotting, bleeding, or passing clots. Tell your caregiver of the amount and how many pads are used. Light spotting in pregnancy is common, especially following intercourse.  You develop a bad smelling vaginal discharge with a change in the color from clear to white.  You continue to feel   sick to your stomach (nauseated) and have no relief from remedies suggested. You vomit blood or coffee ground-like materials.  You lose more than 2 pounds of weight or gain more than 2 pounds of weight over 1 week, or as suggested by your caregiver.  You notice swelling of your face, hands, feet, or legs.  You get exposed to German measles and have never had them.  You are exposed to fifth disease or chickenpox.  You develop belly (abdominal) pain. Round ligament discomfort is a common non-cancerous (benign) cause of abdominal pain in pregnancy. Your caregiver still must evaluate you.  You develop a bad headache that does not go away.  You develop fever, diarrhea, pain with urination, or shortness of breath.  You develop visual problems, blurry, or double vision.  You fall or are in a car accident or any kind of trauma.  There is mental or physical violence at home. Document Released: 10/04/2001 Document Revised: 07/04/2012 Document Reviewed: 04/08/2009 [employer] Patient Information 2014 [employer].  

## 2013-04-18 LAB — OBSTETRIC PANEL
Antibody Screen: NEGATIVE
Basophils Relative: 0 % (ref 0–1)
HCT: 31.8 % — ABNORMAL LOW (ref 36.0–46.0)
Hemoglobin: 10.6 g/dL — ABNORMAL LOW (ref 12.0–15.0)
MCH: 30.3 pg (ref 26.0–34.0)
MCHC: 33.3 g/dL (ref 30.0–36.0)
Monocytes Absolute: 0.3 10*3/uL (ref 0.1–1.0)
Monocytes Relative: 4 % (ref 3–12)
Neutro Abs: 6.2 10*3/uL (ref 1.7–7.7)
Rh Type: POSITIVE

## 2013-04-20 LAB — CULTURE, OB URINE: Colony Count: 90000

## 2013-04-22 LAB — HEMOGLOBINOPATHY EVALUATION: Hgb F Quant: 0 % (ref 0.0–2.0)

## 2013-04-23 ENCOUNTER — Encounter: Payer: Self-pay | Admitting: *Deleted

## 2013-05-01 ENCOUNTER — Ambulatory Visit (HOSPITAL_COMMUNITY)
Admission: RE | Admit: 2013-05-01 | Discharge: 2013-05-01 | Disposition: A | Discharge status: Home / Self Care | Payer: Medicaid Other | Source: Ambulatory Visit | Attending: Advanced Practice Midwife | Admitting: Advanced Practice Midwife

## 2013-05-01 DIAGNOSIS — O0932 Supervision of pregnancy with insufficient antenatal care, second trimester: Secondary | ICD-10-CM

## 2013-05-01 DIAGNOSIS — O0930 Supervision of pregnancy with insufficient antenatal care, unspecified trimester: Secondary | ICD-10-CM | POA: Insufficient documentation

## 2013-05-01 DIAGNOSIS — Z3689 Encounter for other specified antenatal screening: Secondary | ICD-10-CM | POA: Insufficient documentation

## 2013-05-15 ENCOUNTER — Ambulatory Visit (INDEPENDENT_AMBULATORY_CARE_PROVIDER_SITE_OTHER): Payer: Medicaid Other | Admitting: Obstetrics and Gynecology

## 2013-05-15 VITALS — BP 115/63 | Temp 96.9°F | Wt 151.5 lb

## 2013-05-15 DIAGNOSIS — N898 Other specified noninflammatory disorders of vagina: Secondary | ICD-10-CM

## 2013-05-15 DIAGNOSIS — Z3492 Encounter for supervision of normal pregnancy, unspecified, second trimester: Secondary | ICD-10-CM

## 2013-05-15 DIAGNOSIS — Z331 Pregnant state, incidental: Secondary | ICD-10-CM

## 2013-05-15 LAB — POCT URINALYSIS DIP (DEVICE)
Glucose, UA: NEGATIVE mg/dL
Hgb urine dipstick: NEGATIVE
Nitrite: NEGATIVE
Urobilinogen, UA: 0.2 mg/dL (ref 0.0–1.0)
pH: 7 (ref 5.0–8.0)

## 2013-05-15 NOTE — Patient Instructions (Signed)
Pregnancy - Second Trimester The second trimester of pregnancy (3 to 6 months) is a period of rapid growth for you and your baby. At the end of the sixth month, your baby is about 9 inches long and weighs 1 1/2 pounds. You will begin to feel the baby move between 18 and 20 weeks of the pregnancy. This is called quickening. Weight gain is faster. A clear fluid (colostrum) may leak out of your breasts. You may feel small contractions of the womb (uterus). This is known as false labor or Braxton-Hicks contractions. This is like a practice for labor when the baby is ready to be born. Usually, the problems with morning sickness have usually passed by the end of your first trimester. Some women develop small dark blotches (called cholasma, mask of pregnancy) on their face that usually goes away after the baby is born. Exposure to the sun makes the blotches worse. Acne may also develop in some pregnant women and pregnant women who have acne, may find that it goes away. PRENATAL EXAMS  Blood work may continue to be done during prenatal exams. These tests are done to check on your health and the probable health of your baby. Blood work is used to follow your blood levels (hemoglobin). Anemia (low hemoglobin) is common during pregnancy. Iron and vitamins are given to help prevent this. You will also be checked for diabetes between 24 and 28 weeks of the pregnancy. Some of the previous blood tests may be repeated.  The size of the uterus is measured during each visit. This is to make sure that the baby is continuing to grow properly according to the dates of the pregnancy.  Your blood pressure is checked every prenatal visit. This is to make sure you are not getting toxemia.  Your urine is checked to make sure you do not have an infection, diabetes or protein in the urine.  Your weight is checked often to make sure gains are happening at the suggested rate. This is to ensure that both you and your baby are  growing normally.  Sometimes, an ultrasound is performed to confirm the proper growth and development of the baby. This is a test which bounces harmless sound waves off the baby so your caregiver can more accurately determine due dates. Sometimes, a test is done on the amniotic fluid surrounding the baby. This test is called an amniocentesis. The amniotic fluid is obtained by sticking a needle into the belly (abdomen). This is done to check the chromosomes in instances where there is a concern about possible genetic problems with the baby. It is also sometimes done near the end of pregnancy if an early delivery is required. In this case, it is done to help make sure the baby's lungs are mature enough for the baby to live outside of the womb. CHANGES OCCURING IN THE SECOND TRIMESTER OF PREGNANCY Your body goes through many changes during pregnancy. They vary from person to person. Talk to your caregiver about changes you notice that you are concerned about.  During the second trimester, you will likely have an increase in your appetite. It is normal to have cravings for certain foods. This varies from person to person and pregnancy to pregnancy.  Your lower abdomen will begin to bulge.  You may have to urinate more often because the uterus and baby are pressing on your bladder. It is also common to get more bladder infections during pregnancy. You can help this by drinking lots of fluids   and emptying your bladder before and after intercourse.  You may begin to get stretch marks on your hips, abdomen, and breasts. These are normal changes in the body during pregnancy. There are no exercises or medicines to take that prevent this change.  You may begin to develop swollen and bulging veins (varicose veins) in your legs. Wearing support hose, elevating your feet for 15 minutes, 3 to 4 times a day and limiting salt in your diet helps lessen the problem.  Heartburn may develop as the uterus grows and  pushes up against the stomach. Antacids recommended by your caregiver helps with this problem. Also, eating smaller meals 4 to 5 times a day helps.  Constipation can be treated with a stool softener or adding bulk to your diet. Drinking lots of fluids, and eating vegetables, fruits, and whole grains are helpful.  Exercising is also helpful. If you have been very active up until your pregnancy, most of these activities can be continued during your pregnancy. If you have been less active, it is helpful to start an exercise program such as walking.  Hemorrhoids may develop at the end of the second trimester. Warm sitz baths and hemorrhoid cream recommended by your caregiver helps hemorrhoid problems.  Backaches may develop during this time of your pregnancy. Avoid heavy lifting, wear low heal shoes, and practice good posture to help with backache problems.  Some pregnant women develop tingling and numbness of their hand and fingers because of swelling and tightening of ligaments in the wrist (carpel tunnel syndrome). This goes away after the baby is born.  As your breasts enlarge, you may have to get a bigger bra. Get a comfortable, cotton, support bra. Do not get a nursing bra until the last month of the pregnancy if you will be nursing the baby.  You may get a dark line from your belly button to the pubic area called the linea nigra.  You may develop rosy cheeks because of increase blood flow to the face.  You may develop spider looking lines of the face, neck, arms, and chest. These go away after the baby is born. HOME CARE INSTRUCTIONS   It is extremely important to avoid all smoking, herbs, alcohol, and unprescribed drugs during your pregnancy. These chemicals affect the formation and growth of the baby. Avoid these chemicals throughout the pregnancy to ensure the delivery of a healthy infant.  Most of your home care instructions are the same as suggested for the first trimester of your  pregnancy. Keep your caregiver's appointments. Follow your caregiver's instructions regarding medicine use, exercise, and diet.  During pregnancy, you are providing food for you and your baby. Continue to eat regular, well-balanced meals. Choose foods such as meat, fish, milk and other low fat dairy products, vegetables, fruits, and whole-grain breads and cereals. Your caregiver will tell you of the ideal weight gain.  A physical sexual relationship may be continued up until near the end of pregnancy if there are no other problems. Problems could include early (premature) leaking of amniotic fluid from the membranes, vaginal bleeding, abdominal pain, or other medical or pregnancy problems.  Exercise regularly if there are no restrictions. Check with your caregiver if you are unsure of the safety of some of your exercises. The greatest weight gain will occur in the last 2 trimesters of pregnancy. Exercise will help you:  Control your weight.  Get you in shape for labor and delivery.  Lose weight after you have the baby.  Wear   a good support or jogging bra for breast tenderness during pregnancy. This may help if worn during sleep. Pads or tissues may be used in the bra if you are leaking colostrum.  Do not use hot tubs, steam rooms or saunas throughout the pregnancy.  Wear your seat belt at all times when driving. This protects you and your baby if you are in an accident.  Avoid raw meat, uncooked cheese, cat litter boxes, and soil used by cats. These carry germs that can cause birth defects in the baby.  The second trimester is also a good time to visit your dentist for your dental health if this has not been done yet. Getting your teeth cleaned is okay. Use a soft toothbrush. Brush gently during pregnancy.  It is easier to leak urine during pregnancy. Tightening up and strengthening the pelvic muscles will help with this problem. Practice stopping your urination while you are going to the  bathroom. These are the same muscles you need to strengthen. It is also the muscles you would use as if you were trying to stop from passing gas. You can practice tightening these muscles up 10 times a set and repeating this about 3 times per day. Once you know what muscles to tighten up, do not perform these exercises during urination. It is more likely to contribute to an infection by backing up the urine.  Ask for help if you have financial, counseling, or nutritional needs during pregnancy. Your caregiver will be able to offer counseling for these needs as well as refer you for other special needs.  Your skin may become oily. If so, wash your face with mild soap, use non-greasy moisturizer and oil or cream based makeup. MEDICINES AND DRUG USE IN PREGNANCY  Take prenatal vitamins as directed. The vitamin should contain 1 milligram of folic acid. Keep all vitamins out of reach of children. Only a couple vitamins or tablets containing iron may be fatal to a baby or young child when ingested.  Avoid use of all medicines, including herbs, over-the-counter medicines, not prescribed or suggested by your caregiver. Only take over-the-counter or prescription medicines for pain, discomfort, or fever as directed by your caregiver. Do not use aspirin.  Let your caregiver also know about herbs you may be using.  Alcohol is related to a number of birth defects. This includes fetal alcohol syndrome. All alcohol, in any form, should be avoided completely. Smoking will cause low birth rate and premature babies.  Street or illegal drugs are very harmful to the baby. They are absolutely forbidden. A baby born to an addicted mother will be addicted at birth. The baby will go through the same withdrawal an adult does. SEEK MEDICAL CARE IF:  You have any concerns or worries during your pregnancy. It is better to call with your questions if you feel they cannot wait, rather than worry about them. SEEK IMMEDIATE  MEDICAL CARE IF:   An unexplained oral temperature above 102 F (38.9 C) develops, or as your caregiver suggests.  You have leaking of fluid from the vagina (birth canal). If leaking membranes are suspected, take your temperature and tell your caregiver of this when you call.  There is vaginal spotting, bleeding, or passing clots. Tell your caregiver of the amount and how many pads are used. Light spotting in pregnancy is common, especially following intercourse.  You develop a bad smelling vaginal discharge with a change in the color from clear to white.  You continue to feel   sick to your stomach (nauseated) and have no relief from remedies suggested. You vomit blood or coffee ground-like materials.  You lose more than 2 pounds of weight or gain more than 2 pounds of weight over 1 week, or as suggested by your caregiver.  You notice swelling of your face, hands, feet, or legs.  You get exposed to German measles and have never had them.  You are exposed to fifth disease or chickenpox.  You develop belly (abdominal) pain. Round ligament discomfort is a common non-cancerous (benign) cause of abdominal pain in pregnancy. Your caregiver still must evaluate you.  You develop a bad headache that does not go away.  You develop fever, diarrhea, pain with urination, or shortness of breath.  You develop visual problems, blurry, or double vision.  You fall or are in a car accident or any kind of trauma.  There is mental or physical violence at home. Document Released: 10/04/2001 Document Revised: 07/04/2012 Document Reviewed: 04/08/2009 [employer] Patient Information 2014 [employer].  

## 2013-05-15 NOTE — Progress Notes (Signed)
Rebecca Leach is a 22 y.o. G1P0000 at [redacted]w[redacted]d by L=6 here for ROB visit.  +FM, No LOF, No VB, No ctx  +Discharge, white, malodorous, presents for couple of weeks SVE: mild white discharge Discharge: wet prep   Discussed with Patient:  -Plans to breast  feed.  All questions answered. -Continue prenatal vitamins. - Reviewed genetics screen (Quad screen /US).   - Routine precautions discussed (depression, infection s/s).   Patient provided with all pertinent phone numbers for emergencies. - RTC for any VB, regular, painful cramps/ctxs occurring at a rate of >2/10 min, fever (100.5 or higher), n/v/d, any pain that is unresolving or worsening, LOF, decreased fetal movement, CP, SOB, edema  Problems: There are no active problems to display for this patient.   To Do: 1. Wet prep   [ ]  Vaccines: Flu:  Tdap:  [ ]  BCM: IUD  Edu: [x ] PTL precautions; [ ]  BF class; [ ]  childbirth class; [ ]   BF counseling;

## 2013-05-15 NOTE — Progress Notes (Signed)
Pulse- 86   Vaginal discharge- white, discharge with odor

## 2013-05-16 LAB — WET PREP, GENITAL: Trich, Wet Prep: NONE SEEN

## 2013-05-21 ENCOUNTER — Telehealth: Payer: Self-pay | Admitting: *Deleted

## 2013-05-21 NOTE — Telephone Encounter (Addendum)
Message copied by Jill Side on Tue May 21, 2013  3:33 PM ------      Message from: Odelia Gage A      Created: Tue May 21, 2013  1:36 PM      Regarding: FW: Wet prep                   ----- Message -----         From: Tawana Scale, MD         Sent: 05/17/2013   9:48 AM           To: [facility] Admin Pool      Subject: FW: Wet prep                                                         ----- Message -----         From: Tawana Scale, MD         Sent: 05/16/2013   2:10 PM           To: [facility] Admin Pool      Subject: Wet prep                                                 Howdy,       Please call pt and inform her that the discharge is normal without evidence of infection at today's visit.  No Abx required.            Cheers,      Raymon Mutton Odom ------  Called pt and left message on her personal voice mail that her test result is normal and she does not have a vaginal infection which requires treatment.

## 2013-06-03 ENCOUNTER — Inpatient Hospital Stay (HOSPITAL_COMMUNITY)
Admission: AD | Admit: 2013-06-03 | Discharge: 2013-06-03 | Disposition: A | Discharge status: Home / Self Care | Payer: Medicaid Other | Source: Ambulatory Visit | Attending: Obstetrics & Gynecology | Admitting: Obstetrics & Gynecology

## 2013-06-03 ENCOUNTER — Encounter (HOSPITAL_COMMUNITY): Payer: Self-pay | Admitting: *Deleted

## 2013-06-03 DIAGNOSIS — F419 Anxiety disorder, unspecified: Secondary | ICD-10-CM

## 2013-06-03 DIAGNOSIS — O99891 Other specified diseases and conditions complicating pregnancy: Secondary | ICD-10-CM | POA: Insufficient documentation

## 2013-06-03 DIAGNOSIS — Z3492 Encounter for supervision of normal pregnancy, unspecified, second trimester: Secondary | ICD-10-CM

## 2013-06-03 DIAGNOSIS — R0602 Shortness of breath: Secondary | ICD-10-CM | POA: Insufficient documentation

## 2013-06-03 DIAGNOSIS — R079 Chest pain, unspecified: Secondary | ICD-10-CM | POA: Insufficient documentation

## 2013-06-03 LAB — URINALYSIS, ROUTINE W REFLEX MICROSCOPIC
Glucose, UA: NEGATIVE mg/dL
Hgb urine dipstick: NEGATIVE
Specific Gravity, Urine: 1.02 (ref 1.005–1.030)

## 2013-06-03 LAB — URINE MICROSCOPIC-ADD ON

## 2013-06-03 NOTE — MAU Provider Note (Signed)
Attestation of Attending Supervision of Physician Assistant Student: Evaluation and management procedures were performed by the PA Student under my supervision.  I have seen and examined the patient, reviewed the the student's note and chart, and I agree with management and plan.  Madine Sarr, MD, FACOG Attending Obstetrician & Gynecologist Faculty Practice, [facility]   

## 2013-06-03 NOTE — MAU Provider Note (Signed)
History     CSN: [id number]  Arrival date and time: 06/03/13 1517   None     Chief Complaint  Patient presents with  . Shortness of Breath   HPI Comments: Rebecca Leach is a 22 year old G1 at 24 weeks by ultrasound.  She presents today with complaints of chest pressure.  She noticed the pressure at 2 pm and describes it as a someone pushing on her chest. She quantifies the pressure as a 7/10.  She states that the episodes last 30 seconds and then abate.  She denies a history of HTN, DMII, or dyslipidemia.  She denies smoking, drinking, and drugs.  She becomes tearful when discussing the stressors in her life.     Shortness of Breath This is a new problem. The current episode started today. The problem occurs intermittently. The problem has been unchanged. Associated symptoms include chest pain. Pertinent negatives include no abdominal pain, claudication, ear pain, fever, headaches, hemoptysis, leg pain, leg swelling, neck pain, orthopnea, PND, rash, rhinorrhea, sore throat, sputum production, swollen glands, syncope, vomiting or wheezing. The symptoms are aggravated by emotional upset. The patient has no known risk factors for DVT/PE. She has tried nothing for the symptoms. There is no history of asthma, bronchiolitis, chronic lung disease, COPD, DVT, a heart failure, PE or pneumonia.     Past Medical History  Diagnosis Date  . No pertinent past medical history   . Bacterial vaginitis     History reviewed. No pertinent past surgical history.  Family History  Problem Relation Age of Onset  . Hypertension Mother   . Hypertension Maternal Grandmother     History  Substance Use Topics  . Smoking status: Former Smoker -- 0.25 packs/day    Quit date: 12/31/2012  . Smokeless tobacco: Never Used  . Alcohol Use: No     Comment: social    Allergies:  Allergies  Allergen Reactions  . Latex Itching  . Other Hives    mushrooms    Prescriptions prior to admission  Medication Sig  Dispense Refill  . Prenatal Vit-Fe Fumarate-FA (PRENATAL MULTIVITAMIN) TABS tablet Take 1 tablet by mouth daily at 12 noon.        Review of Systems  Constitutional: Negative.  Negative for fever.  HENT: Negative for ear pain, sore throat, rhinorrhea and neck pain.   Respiratory: Negative for hemoptysis, sputum production, shortness of breath and wheezing.   Cardiovascular: Positive for chest pain. Negative for orthopnea, claudication, leg swelling, syncope and PND.  Gastrointestinal: Negative.  Negative for vomiting and abdominal pain.  Genitourinary: Negative.   Musculoskeletal: Negative.   Skin: Negative for rash.  Neurological: Negative.  Negative for headaches.  Endo/Heme/Allergies: Negative.   Psychiatric/Behavioral: The patient is nervous/anxious.    Physical Exam   Blood pressure 107/56, pulse 86, temperature 97.9 F (36.6 C), temperature source Oral, resp. rate 18, height 5\' 7"  (1.702 m), weight 71.305 kg (157 lb 3.2 oz), last menstrual period 12/16/2012, SpO2 100.00%.  Physical Exam  Constitutional: She appears well-developed and well-nourished. No distress.  Eyes: Conjunctivae and EOM are normal. Pupils are equal, round, and reactive to light.  Cardiovascular: Normal rate, regular rhythm, normal heart sounds and intact distal pulses.   Respiratory: Effort normal and breath sounds normal.  GI: Soft. Bowel sounds are normal.  Skin: Skin is warm and dry. She is not diaphoretic.  Psychiatric: She has a normal mood and affect. Her behavior is normal. Judgment and thought content normal.    MAU  Course  ED EKG  Date/Time: 06/03/2013 5:57 PM Performed by: Deliah Boston L Authorized by: Rulon Abide L Previous ECG: no previous ECG available Rhythm: sinus rhythm Rate: normal QRS axis: normal Conduction: conduction normal ST Segments: ST segments normal T Waves: T waves normal Other: no other findings Clinical impression: normal ECG      Assessment and Plan   A/P **Chest Pressure Normal EKG, vital signs, and patient inspection make coronary etiology extremely unlikely.  The clinical picture alludes to anxiety/panic/depression.  A referral to the [facility] for Clinical Social Work has been put in for the 8/14.  The patient agreed to the above plan.    **Fetal Assessment Appropriate for gestational age     Rebecca Leach 06/03/2013, 5:49 PM

## 2013-06-03 NOTE — MAU Note (Signed)
Pt states tha she feels like she is having trouble catching her breath and having pressure in her chest. Feels like her face is "getting numb".Face is not feeling that way now.

## 2013-06-12 ENCOUNTER — Encounter: Payer: Medicaid Other | Admitting: Family

## 2013-06-13 ENCOUNTER — Encounter: Payer: Self-pay | Admitting: Obstetrics & Gynecology

## 2013-06-13 ENCOUNTER — Ambulatory Visit (INDEPENDENT_AMBULATORY_CARE_PROVIDER_SITE_OTHER): Payer: Medicaid Other | Admitting: Obstetrics & Gynecology

## 2013-06-13 VITALS — BP 111/66 | Temp 97.3°F | Wt 156.2 lb

## 2013-06-13 DIAGNOSIS — Z3402 Encounter for supervision of normal first pregnancy, second trimester: Secondary | ICD-10-CM

## 2013-06-13 DIAGNOSIS — D279 Benign neoplasm of unspecified ovary: Secondary | ICD-10-CM

## 2013-06-13 DIAGNOSIS — D271 Benign neoplasm of left ovary: Secondary | ICD-10-CM

## 2013-06-13 DIAGNOSIS — Z3400 Encounter for supervision of normal first pregnancy, unspecified trimester: Secondary | ICD-10-CM

## 2013-06-13 LAB — POCT URINALYSIS DIP (DEVICE)
Bilirubin Urine: NEGATIVE
Glucose, UA: NEGATIVE mg/dL
Ketones, ur: NEGATIVE mg/dL

## 2013-06-13 MED ORDER — DOCUSATE SODIUM 100 MG PO CAPS: 100.0000 mg | ORAL_CAPSULE | Freq: Two times a day (BID) | ORAL | Status: DC

## 2013-06-13 NOTE — Progress Notes (Signed)
C/o cramping.  Cervix is closed and long with good tone.  Pt has large stool volume in rectum.  Pt is constipated.  Reviewed increaseing fiber and taking colace.  IF this does not work pt will try Miralax.

## 2013-06-13 NOTE — Progress Notes (Signed)
P= 83,  States was feeling baby move a lot before, but less now- about 2 times per day. C/o increased pelvic pressure. C/o braxton hicks contractions twice a day .

## 2013-07-04 ENCOUNTER — Encounter: Payer: Self-pay | Admitting: Obstetrics and Gynecology

## 2013-07-04 ENCOUNTER — Ambulatory Visit (INDEPENDENT_AMBULATORY_CARE_PROVIDER_SITE_OTHER): Payer: Medicaid Other | Admitting: Obstetrics and Gynecology

## 2013-07-04 VITALS — BP 122/65 | Temp 97.3°F | Wt 163.1 lb

## 2013-07-04 DIAGNOSIS — Z3400 Encounter for supervision of normal first pregnancy, unspecified trimester: Secondary | ICD-10-CM

## 2013-07-04 DIAGNOSIS — O368190 Decreased fetal movements, unspecified trimester, not applicable or unspecified: Secondary | ICD-10-CM

## 2013-07-04 DIAGNOSIS — D271 Benign neoplasm of left ovary: Secondary | ICD-10-CM

## 2013-07-04 DIAGNOSIS — D279 Benign neoplasm of unspecified ovary: Secondary | ICD-10-CM

## 2013-07-04 DIAGNOSIS — Z3403 Encounter for supervision of normal first pregnancy, third trimester: Secondary | ICD-10-CM

## 2013-07-04 DIAGNOSIS — O368120 Decreased fetal movements, second trimester, not applicable or unspecified: Secondary | ICD-10-CM

## 2013-07-04 LAB — CBC
HCT: 27.7 % — ABNORMAL LOW (ref 36.0–46.0)
Hemoglobin: 9.5 g/dL — ABNORMAL LOW (ref 12.0–15.0)
RDW: 13.3 % (ref 11.5–15.5)
WBC: 11 10*3/uL — ABNORMAL HIGH (ref 4.0–10.5)

## 2013-07-04 LAB — POCT URINALYSIS DIP (DEVICE)
Glucose, UA: NEGATIVE mg/dL
Ketones, ur: NEGATIVE mg/dL
Specific Gravity, Urine: 1.02 (ref 1.005–1.030)

## 2013-07-04 NOTE — Progress Notes (Signed)
Constipation resolved, did not use Miralax. Not feeling FM but lots of audible FM while on EFM. NST reactive. 1 hr glucola today.

## 2013-07-04 NOTE — Patient Instructions (Addendum)
Ovarian Cyst  The ovaries are small organs that are on each side of the uterus. The ovaries are the organs that produce the female hormones, estrogen and progesterone. An ovarian cyst is a sac filled with fluid that can vary in its size. It is normal for a small cyst to form in women who are in the childbearing age and who have menstrual periods. This type of cyst is called a follicle cyst that becomes an ovulation cyst (corpus luteum cyst) after it produces the women's egg. It later goes away on its own if the woman does not become pregnant. There are other kinds of ovarian cysts that may cause problems and may need to be treated. The most serious problem is a cyst with cancer. It should be noted that menopausal women who have an ovarian cyst are at a higher risk of it being a cancer cyst. They should be evaluated very quickly, thoroughly and followed closely. This is especially true in menopausal women because of the high rate of ovarian cancer in women in menopause.  CAUSES AND TYPES OF OVARIAN CYSTS:   FUNCTIONAL CYST: The follicle/corpus luteum cyst is a functional cyst that occurs every month during ovulation with the menstrual cycle. They go away with the next menstrual cycle if the woman does not get pregnant. Usually, there are no symptoms with a functional cyst.   ENDOMETRIOMA CYST: This cyst develops from the lining of the uterus tissue. This cyst gets in or on the ovary. It grows every month from the bleeding during the menstrual period. It is also called a "chocolate cyst" because it becomes filled with blood that turns brown. This cyst can cause pain in the lower abdomen during intercourse and with your menstrual period.   CYSTADENOMA CYST: This cyst develops from the cells on the outside of the ovary. They usually are not cancerous. They can get very big and cause lower abdomen pain and pain with intercourse. This type of cyst can twist on itself, cut off its blood supply and cause severe pain. It  also can easily rupture and cause a lot of pain.   DERMOID CYST: This type of cyst is sometimes found in both ovaries. They are found to have different kinds of body tissue in the cyst. The tissue includes skin, teeth, hair, and/or cartilage. They usually do not have symptoms unless they get very big. Dermoid cysts are rarely cancerous.   POLYCYSTIC OVARY: This is a rare condition with hormone problems that produces many small cysts on both ovaries. The cysts are follicle-like cysts that never produce an egg and become a corpus luteum. It can cause an increase in body weight, infertility, acne, increase in body and facial hair and lack of menstrual periods or rare menstrual periods. Many women with this problem develop type 2 diabetes. The exact cause of this problem is unknown. A polycystic ovary is rarely cancerous.   THECA LUTEIN CYST: Occurs when too much hormone (human chorionic gonadotropin) is produced and over-stimulates the ovaries to produce an egg. They are frequently seen when doctors stimulate the ovaries for invitro-fertilization (test tube babies).   LUTEOMA CYST: This cyst is seen during pregnancy. Rarely it can cause an obstruction to the birth canal during labor and delivery. They usually go away after delivery.  SYMPTOMS    Pelvic pain or pressure.   Pain during sexual intercourse.   Increasing girth (swelling) of the abdomen.   Abnormal menstrual periods.   Increasing pain with menstrual periods.     You stop having menstrual periods and you are not pregnant.  DIAGNOSIS   The diagnosis can be made during:   Routine or annual pelvic examination (common).   Ultrasound.   X-ray of the pelvis.   CT Scan.   MRI.   Blood tests.  TREATMENT    Treatment may only be to follow the cyst monthly for 2 to 3 months with your caregiver. Many go away on their own, especially functional cysts.   May be aspirated (drained) with a long needle with ultrasound, or by laparoscopy (inserting a tube into  the pelvis through a small incision).   The whole cyst can be removed by laparoscopy.   Sometimes the cyst may need to be removed through an incision in the lower abdomen.   Hormone treatment is sometimes used to help dissolve certain cysts.   Birth control pills are sometimes used to help dissolve certain cysts.  HOME CARE INSTRUCTIONS   Follow your caregiver's advice regarding:   Medicine.   Follow up visits to evaluate and treat the cyst.   You may need to come back or make an appointment with another caregiver, to find the exact cause of your cyst, if your caregiver is not a gynecologist.   Get your yearly and recommended pelvic examinations and Pap tests.   Let your caregiver know if you have had an ovarian cyst in the past.  SEEK MEDICAL CARE IF:    Your periods are late, irregular, they stop, or are painful.   Your stomach (abdomen) or pelvic pain does not go away.   Your stomach becomes larger or swollen.   You have pressure on your bladder or trouble emptying your bladder completely.   You have painful sexual intercourse.   You have feelings of fullness, pressure, or discomfort in your stomach.   You lose weight for no apparent reason.   You feel generally ill.   You become constipated.   You lose your appetite.   You develop acne.   You have an increase in body and facial hair.   You are gaining weight, without changing your exercise and eating habits.   You think you are pregnant.  SEEK IMMEDIATE MEDICAL CARE IF:    You have increasing abdominal pain.   You feel sick to your stomach (nausea) and/or vomit.   You develop a fever that comes on suddenly.   You develop abdominal pain during a bowel movement.   Your menstrual periods become heavier than usual.  Document Released: 10/10/2005 Document Revised: 01/02/2012 Document Reviewed: 08/13/2009  [employer] Patient Information 2014 [employer].

## 2013-07-04 NOTE — Progress Notes (Signed)
Pulse- 118   Pt reports not have the baby movement yet

## 2013-07-05 LAB — RPR

## 2013-07-05 LAB — GLUCOSE TOLERANCE, 1 HOUR (50G) W/O FASTING: Glucose, 1 Hour GTT: 74 mg/dL (ref 70–140)

## 2013-07-10 ENCOUNTER — Encounter: Payer: Self-pay | Admitting: *Deleted

## 2013-07-11 ENCOUNTER — Encounter (HOSPITAL_COMMUNITY): Payer: Self-pay | Admitting: *Deleted

## 2013-07-11 ENCOUNTER — Inpatient Hospital Stay (HOSPITAL_COMMUNITY): Payer: Medicaid Other

## 2013-07-11 ENCOUNTER — Inpatient Hospital Stay (HOSPITAL_COMMUNITY)
Admission: AD | Admit: 2013-07-11 | Discharge: 2013-07-11 | Disposition: A | Discharge status: Home / Self Care | Payer: Medicaid Other | Source: Ambulatory Visit | Attending: Obstetrics & Gynecology | Admitting: Obstetrics & Gynecology

## 2013-07-11 DIAGNOSIS — R109 Unspecified abdominal pain: Secondary | ICD-10-CM

## 2013-07-11 DIAGNOSIS — N760 Acute vaginitis: Secondary | ICD-10-CM

## 2013-07-11 DIAGNOSIS — Z3492 Encounter for supervision of normal pregnancy, unspecified, second trimester: Secondary | ICD-10-CM

## 2013-07-11 DIAGNOSIS — N949 Unspecified condition associated with female genital organs and menstrual cycle: Secondary | ICD-10-CM

## 2013-07-11 DIAGNOSIS — O2390 Unspecified genitourinary tract infection in pregnancy, unspecified trimester: Secondary | ICD-10-CM

## 2013-07-11 DIAGNOSIS — A499 Bacterial infection, unspecified: Secondary | ICD-10-CM | POA: Insufficient documentation

## 2013-07-11 DIAGNOSIS — B9689 Other specified bacterial agents as the cause of diseases classified elsewhere: Secondary | ICD-10-CM

## 2013-07-11 LAB — URINALYSIS, ROUTINE W REFLEX MICROSCOPIC
Ketones, ur: NEGATIVE mg/dL
Nitrite: NEGATIVE
Urobilinogen, UA: 0.2 mg/dL (ref 0.0–1.0)
pH: 6 (ref 5.0–8.0)

## 2013-07-11 LAB — URINE MICROSCOPIC-ADD ON

## 2013-07-11 MED ORDER — METRONIDAZOLE 500 MG PO TABS: 500.0000 mg | ORAL_TABLET | Freq: Two times a day (BID) | ORAL | Status: DC

## 2013-07-11 NOTE — MAU Provider Note (Signed)
History     CSN: [id number]  Arrival date and time: 07/11/13 1458   First Provider Initiated Contact with Patient 07/11/13 1546      Chief Complaint  Patient presents with  . Abdominal Pain   HPI Comments: Rebecca Leach is a 22 y.o. G1P0000 at [redacted]w[redacted]d who presents with left sided sharp pain started this morning. Non-migrating. First had pain when she first became pregnant that lasted for one day. She has a history of a left ovarian cyst and has not had any issues with it since the beginning of her pregnancy. She describes the pain as similar to the pain at that time. There are no alleviating or aggravating factors. She has not had any LOF or vaginal bleeding, but she has noticed some red spots when wiping after urinating. She reports good FM. No vaginal discharge. No contractions. No fever, nausea/vomiting, headache, no other abdominal pain, diarrhea/constipation.  Lower abdominal pain is in the LLQ, more so with movement  OB History   Grav Para Term Preterm Abortions TAB SAB Ect Mult Living   1 0 0 0 0 0 0 0 0 0       Past Medical History  Diagnosis Date  . No pertinent past medical history   . Bacterial vaginitis     Past Surgical History  Procedure Laterality Date  . No past surgeries      Family History  Problem Relation Age of Onset  . Hypertension Mother   . Hypertension Maternal Grandmother     History  Substance Use Topics  . Smoking status: Former Smoker -- 0.25 packs/day    Quit date: 12/31/2012  . Smokeless tobacco: Never Used  . Alcohol Use: No     Comment: social    Allergies:  Allergies  Allergen Reactions  . Latex Itching  . Other Hives    mushrooms    Prescriptions prior to admission  Medication Sig Dispense Refill  . Prenatal Vit-Fe Fumarate-FA (PRENATAL MULTIVITAMIN) TABS tablet Take 1 tablet by mouth daily at 12 noon.        Review of Systems  Constitutional: Negative for fever and chills.  Eyes: Negative for blurred vision.   Gastrointestinal: Positive for abdominal pain. Negative for nausea, vomiting, diarrhea and constipation.  Genitourinary: Positive for dysuria.  Neurological: Negative for headaches.   Physical Exam   Blood pressure 123/64, pulse 89, temperature 98.1 F (36.7 C), temperature source Oral, resp. rate 20, weight 74.39 kg (164 lb), last menstrual period 12/16/2012.  Physical Exam  Constitutional: She is oriented to person, place, and time. Vital signs are normal. She appears well-developed and well-nourished. No distress.  Cardiovascular: Normal rate, regular rhythm and normal heart sounds.   No murmur heard. Respiratory: Effort normal and breath sounds normal. No respiratory distress. She has no wheezes. She has no rales.  GI: There is tenderness. There is no rebound and no guarding.  LLQ pain  Musculoskeletal: Normal range of motion. She exhibits no edema and no tenderness.  Neurological: She is alert and oriented to person, place, and time.  Skin: Skin is warm and dry. She is not diaphoretic. No erythema.   FHT: 120, moderate variability, +accels, no decels, Category I UC: none  MAU Course  Procedures  MDM - pelvic US to evaluate cyst - showed cyst is unchanged - pelvic exam to evaluate for vaginal bleeding - care handed over to Cathie Beams, CNM  Urinalysis    Component Value Date/Time   COLORURINE YELLOW 07/11/2013 1523  APPEARANCEUR CLEAR 07/11/2013 1523   LABSPEC 1.025 07/11/2013 1523   PHURINE 6.0 07/11/2013 1523   GLUCOSEU NEGATIVE 07/11/2013 1523   HGBUR NEGATIVE 07/11/2013 1523   BILIRUBINUR NEGATIVE 07/11/2013 1523   KETONESUR NEGATIVE 07/11/2013 1523   PROTEINUR NEGATIVE 07/11/2013 1523   UROBILINOGEN 0.2 07/11/2013 1523   NITRITE NEGATIVE 07/11/2013 1523   LEUKOCYTESUR SMALL* 07/11/2013 1523     Pelvic:  This white frothy d/c.  Wet prep + clue, no trich, wBC or yeast.  No blood whatsoever.  CRESENZO-DISHMAN,Caliann Leckrone   Assessment and Plan   BV-flagyl 500mg   BID X7 Round ligament pain  CRESENZO-DISHMAN,Arita Severtson  Jacquelin Hawking 07/11/2013, 3:50 PM

## 2013-07-11 NOTE — Progress Notes (Signed)
MD informed that pt states she went to [facility] & noticed blood in her urine & is very concerned.  MD in delivery, he will be returning to see pt.

## 2013-07-11 NOTE — MAU Note (Signed)
Constant pain- "bladder" started this morning.  Sharp pain when tries to void, frequency and urgency.

## 2013-07-18 ENCOUNTER — Ambulatory Visit (INDEPENDENT_AMBULATORY_CARE_PROVIDER_SITE_OTHER): Payer: Medicaid Other | Admitting: Family Medicine

## 2013-07-18 ENCOUNTER — Encounter: Payer: Self-pay | Admitting: Family Medicine

## 2013-07-18 VITALS — BP 110/62 | Temp 98.4°F | Wt 170.0 lb

## 2013-07-18 DIAGNOSIS — Z3402 Encounter for supervision of normal first pregnancy, second trimester: Secondary | ICD-10-CM

## 2013-07-18 DIAGNOSIS — Z3400 Encounter for supervision of normal first pregnancy, unspecified trimester: Secondary | ICD-10-CM

## 2013-07-18 LAB — POCT URINALYSIS DIP (DEVICE)
Ketones, ur: NEGATIVE mg/dL
Protein, ur: NEGATIVE mg/dL
Specific Gravity, Urine: 1.015 (ref 1.005–1.030)
pH: 7 (ref 5.0–8.0)

## 2013-07-18 NOTE — Progress Notes (Signed)
Pulse-105 

## 2013-07-18 NOTE — Progress Notes (Signed)
S: 22 yo G1 @ [redacted]w[redacted]d here for RPBV - no vb, LOF. +FM. Some irregular contractions.  - was seen in the MAU on 9/18 for cyst on ovary- was having pain and blood in urine. Now doing better overall.   O: see flowsheet  A/P: - doing well - PTL precautions discussed - dermoid cyst on [place] that ws unchanged. Will likely monitor post pregnancy also - large leuks on UA, will send cx. - f/u in 2 weeks.

## 2013-07-23 ENCOUNTER — Inpatient Hospital Stay (HOSPITAL_COMMUNITY)
Admission: AD | Admit: 2013-07-23 | Discharge: 2013-07-23 | Disposition: A | Discharge status: Home / Self Care | Payer: Medicaid Other | Source: Ambulatory Visit | Attending: Obstetrics & Gynecology | Admitting: Obstetrics & Gynecology

## 2013-07-23 ENCOUNTER — Encounter (HOSPITAL_COMMUNITY): Payer: Self-pay | Admitting: *Deleted

## 2013-07-23 DIAGNOSIS — B379 Candidiasis, unspecified: Secondary | ICD-10-CM

## 2013-07-23 DIAGNOSIS — N949 Unspecified condition associated with female genital organs and menstrual cycle: Secondary | ICD-10-CM | POA: Insufficient documentation

## 2013-07-23 DIAGNOSIS — O2343 Unspecified infection of urinary tract in pregnancy, third trimester: Secondary | ICD-10-CM

## 2013-07-23 DIAGNOSIS — B3731 Acute candidiasis of vulva and vagina: Secondary | ICD-10-CM | POA: Insufficient documentation

## 2013-07-23 DIAGNOSIS — O2390 Unspecified genitourinary tract infection in pregnancy, unspecified trimester: Secondary | ICD-10-CM | POA: Insufficient documentation

## 2013-07-23 DIAGNOSIS — R109 Unspecified abdominal pain: Secondary | ICD-10-CM | POA: Insufficient documentation

## 2013-07-23 DIAGNOSIS — B373 Candidiasis of vulva and vagina: Secondary | ICD-10-CM | POA: Insufficient documentation

## 2013-07-23 DIAGNOSIS — O368190 Decreased fetal movements, unspecified trimester, not applicable or unspecified: Secondary | ICD-10-CM | POA: Insufficient documentation

## 2013-07-23 DIAGNOSIS — R82998 Other abnormal findings in urine: Secondary | ICD-10-CM | POA: Insufficient documentation

## 2013-07-23 LAB — URINALYSIS, ROUTINE W REFLEX MICROSCOPIC
Bilirubin Urine: NEGATIVE
Glucose, UA: NEGATIVE mg/dL
Hgb urine dipstick: NEGATIVE
Ketones, ur: NEGATIVE mg/dL
pH: 6 (ref 5.0–8.0)

## 2013-07-23 LAB — WET PREP, GENITAL: Trich, Wet Prep: NONE SEEN

## 2013-07-23 LAB — URINE MICROSCOPIC-ADD ON

## 2013-07-23 MED ORDER — KEFLEX 500 MG PO CAPS: 500.0000 mg | ORAL_CAPSULE | Freq: Three times a day (TID) | ORAL | Status: DC

## 2013-07-23 MED ORDER — FLUCONAZOLE 150 MG PO TABS: 150.0000 mg | ORAL_TABLET | Freq: Once | ORAL | Status: DC

## 2013-07-23 MED ORDER — FLUCONAZOLE 150 MG PO TABS
150.0000 mg | ORAL_TABLET | Freq: Once | ORAL | Status: AC
  Administered 2013-07-23: 150 mg via ORAL
  Filled 2013-07-23: qty 1

## 2013-07-23 NOTE — MAU Note (Signed)
Patient states she had sudden onset of vaginal and lower abdominal pain at about 1430. States she passed some milky liquid in the toilet x 2. States she continues to feel leaking. Has not felt fetal movement today. Fetal heart rate in triage in the 130's.

## 2013-07-23 NOTE — MAU Provider Note (Signed)
History     CSN: [id number]  Arrival date and time: 07/23/13 1806   None     Chief Complaint  Patient presents with  . Vaginal Pain  . Abdominal Pain  . Vaginal Discharge  . Decreased Fetal Movement   HPI  Pt is a G1P0000 at [redacted]w[redacted]d weeks IUP here with report of sudden onset of vaginal and lower abdominal pain at about 1430. States she passed some milky liquid in the toilet x 2. States she continues to feel leaking. Has not felt fetal movement today. Last intercourse three weeks ago.  Denies vaginal bleeding.     Past Medical History  Diagnosis Date  . No pertinent past medical history   . Bacterial vaginitis     Past Surgical History  Procedure Laterality Date  . No past surgeries      Family History  Problem Relation Age of Onset  . Hypertension Mother   . Hypertension Maternal Grandmother     History  Substance Use Topics  . Smoking status: Former Smoker -- 0.25 packs/day    Quit date: 12/31/2012  . Smokeless tobacco: Never Used  . Alcohol Use: No     Comment: social    Allergies:  Allergies  Allergen Reactions  . Latex Itching  . Other Hives    mushrooms    Prescriptions prior to admission  Medication Sig Dispense Refill  . metroNIDAZOLE (FLAGYL) 500 MG tablet Take 1 tablet (500 mg total) by mouth 2 (two) times daily with a meal.  14 tablet  0  . [DISCONTINUED] Prenatal Vit-Fe Fumarate-FA (PRENATAL MULTIVITAMIN) TABS tablet Take 1 tablet by mouth daily at 12 noon.        Review of Systems  Gastrointestinal: Positive for abdominal pain (lower pelvic pain).  Genitourinary:       Vaginal discharge and pain  All other systems reviewed and are negative.   Physical Exam   Blood pressure 108/78, pulse 104, temperature 97.9 F (36.6 C), temperature source Oral, resp. rate 16, last menstrual period 12/16/2012.  Physical Exam  Constitutional: She is oriented to person, place, and time. She appears well-developed and well-nourished. No distress.   HENT:  Head: Normocephalic.  Neck: Normal range of motion. Neck supple.  Cardiovascular: Normal rate, regular rhythm and normal heart sounds.   Respiratory: Effort normal and breath sounds normal.  GI: Soft. There is no tenderness.  Genitourinary: No bleeding around the vagina. Vaginal discharge (thin white, creamy discharge; negative pooling) found.  Musculoskeletal: Normal range of motion.  Neurological: She is alert and oriented to person, place, and time.  Skin: Skin is warm and dry.   Cervix - closed  MAU Course  Procedures  Results for orders placed during the hospital encounter of 07/23/13 (from the past 24 hour(s))  URINALYSIS, ROUTINE W REFLEX MICROSCOPIC     Status: Abnormal   Collection Time    07/23/13  6:25 PM      Result Value Range   Color, Urine YELLOW  YELLOW   APPearance CLEAR  CLEAR   Specific Gravity, Urine 1.010  1.005 - 1.030   pH 6.0  5.0 - 8.0   Glucose, UA NEGATIVE  NEGATIVE mg/dL   Hgb urine dipstick NEGATIVE  NEGATIVE   Bilirubin Urine NEGATIVE  NEGATIVE   Ketones, ur NEGATIVE  NEGATIVE mg/dL   Protein, ur NEGATIVE  NEGATIVE mg/dL   Urobilinogen, UA 0.2  0.0 - 1.0 mg/dL   Nitrite NEGATIVE  NEGATIVE   Leukocytes, UA MODERATE (*) NEGATIVE  URINE MICROSCOPIC-ADD ON     Status: Abnormal   Collection Time    07/23/13  6:25 PM      Result Value Range   Squamous Epithelial / LPF FEW (*) RARE   WBC, UA 7-10  <3 WBC/hpf   RBC / HPF 0-2  <3 RBC/hpf   Bacteria, UA FEW (*) RARE   Microscopic wet-mount exam shows clue cells, yeast.  Ferning - negative  FHR 140's, +accels, reactive Toco - none Assessment and Plan  22 yo G1P0000 at [redacted]w[redacted]d wks IUP Bacteriuria  Yeast Infection  Plan: Discharge to home RX Keflex 500 mg TID x 7 days Urine culture Diflucan 150 mg  [facility] 07/23/2013, 6:59 PM

## 2013-07-26 LAB — URINE CULTURE

## 2013-08-06 ENCOUNTER — Encounter: Payer: Self-pay | Admitting: Family Medicine

## 2013-08-06 ENCOUNTER — Ambulatory Visit (INDEPENDENT_AMBULATORY_CARE_PROVIDER_SITE_OTHER): Payer: Medicaid Other | Admitting: Family Medicine

## 2013-08-06 VITALS — BP 95/63 | Temp 97.2°F | Wt 177.5 lb

## 2013-08-06 DIAGNOSIS — O2390 Unspecified genitourinary tract infection in pregnancy, unspecified trimester: Secondary | ICD-10-CM

## 2013-08-06 DIAGNOSIS — Z3403 Encounter for supervision of normal first pregnancy, third trimester: Secondary | ICD-10-CM

## 2013-08-06 DIAGNOSIS — Z23 Encounter for immunization: Secondary | ICD-10-CM

## 2013-08-06 DIAGNOSIS — B951 Streptococcus, group B, as the cause of diseases classified elsewhere: Secondary | ICD-10-CM | POA: Insufficient documentation

## 2013-08-06 LAB — POCT URINALYSIS DIP (DEVICE)
Glucose, UA: NEGATIVE mg/dL
Hgb urine dipstick: NEGATIVE
Nitrite: NEGATIVE
Urobilinogen, UA: 0.2 mg/dL (ref 0.0–1.0)

## 2013-08-06 MED ORDER — CEPHALEXIN 500 MG PO CAPS: 500.0000 mg | ORAL_CAPSULE | Freq: Four times a day (QID) | ORAL | Status: DC

## 2013-08-06 MED ORDER — TETANUS-DIPHTH-ACELL PERTUSSIS 5-2.5-18.5 LF-MCG/0.5 IM SUSP: 0.5000 mL | Freq: Once | INTRAMUSCULAR | Status: DC

## 2013-08-06 NOTE — Patient Instructions (Signed)

## 2013-08-06 NOTE — Addendum Note (Signed)
Addended by: Louanna Raw on: 08/06/2013 04:45 PM   Modules accepted: Orders

## 2013-08-06 NOTE — Progress Notes (Signed)
P=103, States not taking keflex because too expensive. Not taking pnv because made her constipated.

## 2013-08-06 NOTE — Progress Notes (Signed)
22 yo @ [redacted]w[redacted]d here for ROBV - didn't take abx for UTI because was told it would be $200- still having symptoms - did take the medicine for yeast. Feeling better overall - had first braxton hicks contraction over the weekend. None since.  - no ctx, vb, lof. +FM  O: see flowsheet  A/P - confirmed keflex is on $4 list and resent rx. Told to call if too expensive.  - will try flinstone vitamins for PNV - tdap and flu today - desires nexplanon for contraception - f/u in 3 weeks.

## 2013-08-07 ENCOUNTER — Encounter: Payer: Self-pay | Admitting: Family Medicine

## 2013-08-15 ENCOUNTER — Encounter: Payer: Self-pay | Admitting: *Deleted

## 2013-08-20 ENCOUNTER — Encounter: Payer: Medicaid Other | Admitting: Family Medicine

## 2013-08-28 ENCOUNTER — Ambulatory Visit (INDEPENDENT_AMBULATORY_CARE_PROVIDER_SITE_OTHER): Payer: Medicaid Other | Admitting: Advanced Practice Midwife

## 2013-08-28 VITALS — BP 110/67 | Temp 97.6°F | Wt 187.6 lb

## 2013-08-28 DIAGNOSIS — O2390 Unspecified genitourinary tract infection in pregnancy, unspecified trimester: Secondary | ICD-10-CM

## 2013-08-28 DIAGNOSIS — Z3490 Encounter for supervision of normal pregnancy, unspecified, unspecified trimester: Secondary | ICD-10-CM

## 2013-08-28 LAB — POCT URINALYSIS DIP (DEVICE)
Bilirubin Urine: NEGATIVE
Glucose, UA: NEGATIVE mg/dL
Nitrite: NEGATIVE
Protein, ur: NEGATIVE mg/dL
pH: 5.5 (ref 5.0–8.0)

## 2013-08-28 LAB — OB RESULTS CONSOLE GC/CHLAMYDIA: Gonorrhea: NEGATIVE

## 2013-08-28 NOTE — Progress Notes (Signed)
Pulse- 90 Patient reports pelvic pressure and irregular contractions

## 2013-08-28 NOTE — Patient Instructions (Signed)
Third Trimester of Pregnancy  The third trimester is from week 29 through week 42, months 7 through 9. The third trimester is a time when the fetus is growing rapidly. At the end of the ninth month, the fetus is about 20 inches in length and weighs 6 10 pounds.   BODY CHANGES  Your body goes through many changes during pregnancy. The changes vary from woman to woman.    Your weight will continue to increase. You can expect to gain 25 35 pounds (11 16 kg) by the end of the pregnancy.   You may begin to get stretch marks on your hips, abdomen, and breasts.   You may urinate more often because the fetus is moving lower into your pelvis and pressing on your bladder.   You may develop or continue to have heartburn as a result of your pregnancy.   You may develop constipation because certain hormones are causing the muscles that push waste through your intestines to slow down.   You may develop hemorrhoids or swollen, bulging veins (varicose veins).   You may have pelvic pain because of the weight gain and pregnancy hormones relaxing your joints between the bones in your pelvis. Back aches may result from over exertion of the muscles supporting your posture.   Your breasts will continue to grow and be tender. A yellow discharge may leak from your breasts called colostrum.   Your belly button may stick out.   You may feel short of breath because of your expanding uterus.   You may notice the fetus "dropping," or moving lower in your abdomen.   You may have a bloody mucus discharge. This usually occurs a few days to a week before labor begins.   Your cervix becomes thin and soft (effaced) near your due date.  WHAT TO EXPECT AT YOUR PRENATAL EXAMS   You will have prenatal exams every 2 weeks until week 36. Then, you will have weekly prenatal exams. During a routine prenatal visit:   You will be weighed to make sure you and the fetus are growing normally.   Your blood pressure is taken.   Your abdomen will be  measured to track your baby's growth.   The fetal heartbeat will be listened to.   Any test results from the previous visit will be discussed.   You may have a cervical check near your due date to see if you have effaced.  At around 36 weeks, your caregiver will check your cervix. At the same time, your caregiver will also perform a test on the secretions of the vaginal tissue. This test is to determine if a type of bacteria, Group B streptococcus, is present. Your caregiver will explain this further.  Your caregiver may ask you:   What your birth plan is.   How you are feeling.   If you are feeling the baby move.   If you have had any abnormal symptoms, such as leaking fluid, bleeding, severe headaches, or abdominal cramping.   If you have any questions.  Other tests or screenings that may be performed during your third trimester include:   Blood tests that check for low iron levels (anemia).   Fetal testing to check the health, activity level, and growth of the fetus. Testing is done if you have certain medical conditions or if there are problems during the pregnancy.  FALSE LABOR  You may feel small, irregular contractions that eventually go away. These are called Braxton Hicks contractions, or   false labor. Contractions may last for hours, days, or even weeks before true labor sets in. If contractions come at regular intervals, intensify, or become painful, it is best to be seen by your caregiver.   SIGNS OF LABOR    Menstrual-like cramps.   Contractions that are 5 minutes apart or less.   Contractions that start on the top of the uterus and spread down to the lower abdomen and back.   A sense of increased pelvic pressure or back pain.   A watery or bloody mucus discharge that comes from the vagina.  If you have any of these signs before the 37th week of pregnancy, call your caregiver right away. You need to go to the hospital to get checked immediately.  HOME CARE INSTRUCTIONS    Avoid all  smoking, herbs, alcohol, and unprescribed drugs. These chemicals affect the formation and growth of the baby.   Follow your caregiver's instructions regarding medicine use. There are medicines that are either safe or unsafe to take during pregnancy.   Exercise only as directed by your caregiver. Experiencing uterine cramps is a good sign to stop exercising.   Continue to eat regular, healthy meals.   Wear a good support bra for breast tenderness.   Do not use hot tubs, steam rooms, or saunas.   Wear your seat belt at all times when driving.   Avoid raw meat, uncooked cheese, cat litter boxes, and soil used by cats. These carry germs that can cause birth defects in the baby.   Take your prenatal vitamins.   Try taking a stool softener (if your caregiver approves) if you develop constipation. Eat more high-fiber foods, such as fresh vegetables or fruit and whole grains. Drink plenty of fluids to keep your urine clear or pale yellow.   Take warm sitz baths to soothe any pain or discomfort caused by hemorrhoids. Use hemorrhoid cream if your caregiver approves.   If you develop varicose veins, wear support hose. Elevate your feet for 15 minutes, 3 4 times a day. Limit salt in your diet.   Avoid heavy lifting, wear low heal shoes, and practice good posture.   Rest a lot with your legs elevated if you have leg cramps or low back pain.   Visit your dentist if you have not gone during your pregnancy. Use a soft toothbrush to brush your teeth and be gentle when you floss.   A sexual relationship may be continued unless your caregiver directs you otherwise.   Do not travel far distances unless it is absolutely necessary and only with the approval of your caregiver.   Take prenatal classes to understand, practice, and ask questions about the labor and delivery.   Make a trial run to the hospital.   Pack your hospital bag.   Prepare the baby's nursery.   Continue to go to all your prenatal visits as directed  by your caregiver.  SEEK MEDICAL CARE IF:   You are unsure if you are in labor or if your water has broken.   You have dizziness.   You have mild pelvic cramps, pelvic pressure, or nagging pain in your abdominal area.   You have persistent nausea, vomiting, or diarrhea.   You have a bad smelling vaginal discharge.   You have pain with urination.  SEEK IMMEDIATE MEDICAL CARE IF:    You have a fever.   You are leaking fluid from your vagina.   You have spotting or bleeding from your vagina.     You have severe abdominal cramping or pain.   You have rapid weight loss or gain.   You have shortness of breath with chest pain.   You notice sudden or extreme swelling of your face, hands, ankles, feet, or legs.   You have not felt your baby move in over an hour.   You have severe headaches that do not go away with medicine.   You have vision changes.  Document Released: 10/04/2001 Document Revised: 06/12/2013 Document Reviewed: 12/11/2012  ExitCare Patient Information 2014 [employer].

## 2013-08-28 NOTE — Progress Notes (Signed)
Doing well, but having some pressure. Cultures and GBS done.

## 2013-08-29 LAB — GC/CHLAMYDIA PROBE AMP: CT Probe RNA: NEGATIVE

## 2013-09-01 LAB — CULTURE, BETA STREP (GROUP B ONLY)

## 2013-09-04 ENCOUNTER — Ambulatory Visit (INDEPENDENT_AMBULATORY_CARE_PROVIDER_SITE_OTHER): Payer: Medicaid Other | Admitting: Family Medicine

## 2013-09-04 ENCOUNTER — Encounter: Payer: Self-pay | Admitting: Family Medicine

## 2013-09-04 VITALS — BP 120/77 | Wt 187.6 lb

## 2013-09-04 DIAGNOSIS — Z3403 Encounter for supervision of normal first pregnancy, third trimester: Secondary | ICD-10-CM

## 2013-09-04 DIAGNOSIS — O2390 Unspecified genitourinary tract infection in pregnancy, unspecified trimester: Secondary | ICD-10-CM

## 2013-09-04 LAB — POCT URINALYSIS DIP (DEVICE)
Ketones, ur: NEGATIVE mg/dL
Nitrite: NEGATIVE
Protein, ur: NEGATIVE mg/dL
Specific Gravity, Urine: 1.025 (ref 1.005–1.030)
pH: 6 (ref 5.0–8.0)

## 2013-09-04 NOTE — Patient Instructions (Signed)
Third Trimester of Pregnancy  The third trimester is from week 29 through week 42, months 7 through 9. The third trimester is a time when the fetus is growing rapidly. At the end of the ninth month, the fetus is about 20 inches in length and weighs 6 10 pounds.   BODY CHANGES  Your body goes through many changes during pregnancy. The changes vary from woman to woman.    Your weight will continue to increase. You can expect to gain 25 35 pounds (11 16 kg) by the end of the pregnancy.   You may begin to get stretch marks on your hips, abdomen, and breasts.   You may urinate more often because the fetus is moving lower into your pelvis and pressing on your bladder.   You may develop or continue to have heartburn as a result of your pregnancy.   You may develop constipation because certain hormones are causing the muscles that push waste through your intestines to slow down.   You may develop hemorrhoids or swollen, bulging veins (varicose veins).   You may have pelvic pain because of the weight gain and pregnancy hormones relaxing your joints between the bones in your pelvis. Back aches may result from over exertion of the muscles supporting your posture.   Your breasts will continue to grow and be tender. A yellow discharge may leak from your breasts called colostrum.   Your belly button may stick out.   You may feel short of breath because of your expanding uterus.   You may notice the fetus "dropping," or moving lower in your abdomen.   You may have a bloody mucus discharge. This usually occurs a few days to a week before labor begins.   Your cervix becomes thin and soft (effaced) near your due date.  WHAT TO EXPECT AT YOUR PRENATAL EXAMS   You will have prenatal exams every 2 weeks until week 36. Then, you will have weekly prenatal exams. During a routine prenatal visit:   You will be weighed to make sure you and the fetus are growing normally.   Your blood pressure is taken.   Your abdomen will be  measured to track your baby's growth.   The fetal heartbeat will be listened to.   Any test results from the previous visit will be discussed.   You may have a cervical check near your due date to see if you have effaced.  At around 36 weeks, your caregiver will check your cervix. At the same time, your caregiver will also perform a test on the secretions of the vaginal tissue. This test is to determine if a type of bacteria, Group B streptococcus, is present. Your caregiver will explain this further.  Your caregiver may ask you:   What your birth plan is.   How you are feeling.   If you are feeling the baby move.   If you have had any abnormal symptoms, such as leaking fluid, bleeding, severe headaches, or abdominal cramping.   If you have any questions.  Other tests or screenings that may be performed during your third trimester include:   Blood tests that check for low iron levels (anemia).   Fetal testing to check the health, activity level, and growth of the fetus. Testing is done if you have certain medical conditions or if there are problems during the pregnancy.  FALSE LABOR  You may feel small, irregular contractions that eventually go away. These are called Braxton Hicks contractions, or   false labor. Contractions may last for hours, days, or even weeks before true labor sets in. If contractions come at regular intervals, intensify, or become painful, it is best to be seen by your caregiver.   SIGNS OF LABOR    Menstrual-like cramps.   Contractions that are 5 minutes apart or less.   Contractions that start on the top of the uterus and spread down to the lower abdomen and back.   A sense of increased pelvic pressure or back pain.   A watery or bloody mucus discharge that comes from the vagina.  If you have any of these signs before the 37th week of pregnancy, call your caregiver right away. You need to go to the hospital to get checked immediately.  HOME CARE INSTRUCTIONS    Avoid all  smoking, herbs, alcohol, and unprescribed drugs. These chemicals affect the formation and growth of the baby.   Follow your caregiver's instructions regarding medicine use. There are medicines that are either safe or unsafe to take during pregnancy.   Exercise only as directed by your caregiver. Experiencing uterine cramps is a good sign to stop exercising.   Continue to eat regular, healthy meals.   Wear a good support bra for breast tenderness.   Do not use hot tubs, steam rooms, or saunas.   Wear your seat belt at all times when driving.   Avoid raw meat, uncooked cheese, cat litter boxes, and soil used by cats. These carry germs that can cause birth defects in the baby.   Take your prenatal vitamins.   Try taking a stool softener (if your caregiver approves) if you develop constipation. Eat more high-fiber foods, such as fresh vegetables or fruit and whole grains. Drink plenty of fluids to keep your urine clear or pale yellow.   Take warm sitz baths to soothe any pain or discomfort caused by hemorrhoids. Use hemorrhoid cream if your caregiver approves.   If you develop varicose veins, wear support hose. Elevate your feet for 15 minutes, 3 4 times a day. Limit salt in your diet.   Avoid heavy lifting, wear low heal shoes, and practice good posture.   Rest a lot with your legs elevated if you have leg cramps or low back pain.   Visit your dentist if you have not gone during your pregnancy. Use a soft toothbrush to brush your teeth and be gentle when you floss.   A sexual relationship may be continued unless your caregiver directs you otherwise.   Do not travel far distances unless it is absolutely necessary and only with the approval of your caregiver.   Take prenatal classes to understand, practice, and ask questions about the labor and delivery.   Make a trial run to the hospital.   Pack your hospital bag.   Prepare the baby's nursery.   Continue to go to all your prenatal visits as directed  by your caregiver.  SEEK MEDICAL CARE IF:   You are unsure if you are in labor or if your water has broken.   You have dizziness.   You have mild pelvic cramps, pelvic pressure, or nagging pain in your abdominal area.   You have persistent nausea, vomiting, or diarrhea.   You have a bad smelling vaginal discharge.   You have pain with urination.  SEEK IMMEDIATE MEDICAL CARE IF:    You have a fever.   You are leaking fluid from your vagina.   You have spotting or bleeding from your vagina.     You have severe abdominal cramping or pain.   You have rapid weight loss or gain.   You have shortness of breath with chest pain.   You notice sudden or extreme swelling of your face, hands, ankles, feet, or legs.   You have not felt your baby move in over an hour.   You have severe headaches that do not go away with medicine.   You have vision changes.  Document Released: 10/04/2001 Document Revised: 06/12/2013 Document Reviewed: 12/11/2012  ExitCare Patient Information 2014 [employer].

## 2013-09-04 NOTE — Progress Notes (Signed)
p-[id number] 

## 2013-09-04 NOTE — Progress Notes (Signed)
+  FM, no lof, no vb, + ctx every other day  Rebecca Leach is a 22 y.o. G1P0000 at [redacted]w[redacted]d here for ROB visit.    Discussed with Patient:  - Plans to breast feed.  All questions answered. - Continue prenatal vitamins. - Reviewed fetal kick counts Pt to perform daily at a time when the baby is active, lie laterally with both hands on belly in quiet room and count all movements (hiccups, shoulder rolls, obvious kicks, etc); pt is to report to clinic MAU for less than 10 movements felt in a 2 hour time period-pt told as soon as she counts 10 movements the count is complete.  - Routine precautions discussed (depression, infection s/s).   Patient provided with all pertinent phone numbers for emergencies. - RTC for any VB, regular, painful cramps/ctxs occurring at a rate of >2/10 min, fever (100.5 or higher), n/v/d, any pain that is unresolving or worsening, LOF, decreased fetal movement, CP, SOB, edema -RTC in one week for next visit.  Problems: Patient Active Problem List   Diagnosis Date Noted  . GBS (group B streptococcus) UTI complicating pregnancy 08/06/2013  . Supervision of low-risk first pregnancy 06/13/2013  . Dermoid cyst of left ovary 06/13/2013  . Second trimester pregnancy 05/15/2013    To Do: 1.  [ ]  Vaccines: Flu: recd  Tdap: recd [ ]  BCM: mirena [ ]  Readiness: baby has a place to sleep, car seat, other baby necessities.  Edu: [x ] TL precautions; [ ]  BF class; [ ]  childbirth class; [ ]   BF counseling;

## 2013-09-08 ENCOUNTER — Encounter (HOSPITAL_COMMUNITY): Payer: Self-pay | Admitting: *Deleted

## 2013-09-08 ENCOUNTER — Inpatient Hospital Stay (HOSPITAL_COMMUNITY)
Admission: AD | Admit: 2013-09-08 | Discharge: 2013-09-08 | Disposition: A | Discharge status: Home / Self Care | Payer: Medicaid Other | Source: Ambulatory Visit | Attending: Obstetrics & Gynecology | Admitting: Obstetrics & Gynecology

## 2013-09-08 DIAGNOSIS — O479 False labor, unspecified: Secondary | ICD-10-CM | POA: Insufficient documentation

## 2013-09-08 DIAGNOSIS — Z3492 Encounter for supervision of normal pregnancy, unspecified, second trimester: Secondary | ICD-10-CM

## 2013-09-08 NOTE — MAU Note (Signed)
Patient presents with complaint of contractions 20 minutes apart since 1700 today.

## 2013-09-10 ENCOUNTER — Encounter: Payer: Self-pay | Admitting: Advanced Practice Midwife

## 2013-09-11 ENCOUNTER — Encounter: Payer: Medicaid Other | Admitting: Obstetrics and Gynecology

## 2013-09-11 ENCOUNTER — Ambulatory Visit (INDEPENDENT_AMBULATORY_CARE_PROVIDER_SITE_OTHER): Payer: Medicaid Other | Admitting: Obstetrics and Gynecology

## 2013-09-11 VITALS — BP 119/80 | Temp 97.1°F | Wt 189.1 lb

## 2013-09-11 DIAGNOSIS — Z3403 Encounter for supervision of normal first pregnancy, third trimester: Secondary | ICD-10-CM

## 2013-09-11 DIAGNOSIS — O2390 Unspecified genitourinary tract infection in pregnancy, unspecified trimester: Secondary | ICD-10-CM

## 2013-09-11 LAB — POCT URINALYSIS DIP (DEVICE)
Glucose, UA: NEGATIVE mg/dL
Nitrite: NEGATIVE
Urobilinogen, UA: 0.2 mg/dL (ref 0.0–1.0)

## 2013-09-11 NOTE — Progress Notes (Signed)
Doing well. S/Sx labor, danger signs reviewed. Plans IUD.

## 2013-09-11 NOTE — Progress Notes (Signed)
Pulse- 87 Patient reports pelvic pressure and irregular contractions

## 2013-09-11 NOTE — Patient Instructions (Signed)

## 2013-09-12 NOTE — Progress Notes (Signed)
   Complete physical exam  Patient: Rebecca Leach   DOB: 08/13/1999   22 y.o. Female  MRN: [id number]  Subjective:    No chief complaint on file.   Rebecca Leach is a 22 y.o. female who presents today for a complete physical exam. She reports consuming a {diet types:17450} diet. {types:19826} She generally feels {DESC; WELL/FAIRLY WELL/POORLY:18703}. She reports sleeping {DESC; WELL/FAIRLY WELL/POORLY:18703}. She {does/does not:200015} have additional problems to discuss today.    Most recent fall risk assessment:    04/20/2022   10:42 AM  Fall Risk   Falls in the past year? 0  Number falls in past yr: 0  Injury with Fall? 0  Risk for fall due to : No Fall Risks  Follow up Falls evaluation completed     Most recent depression screenings:    04/20/2022   10:42 AM 03/11/2021   10:46 AM  PHQ 2/9 Scores  PHQ - 2 Score 0 0  PHQ- 9 Score 5     {VISON DENTAL STD PSA (Optional):27386}  {History (Optional):23778}  Patient Care Team: Jessup, Joy, NP as PCP - General (Nurse Practitioner)   Outpatient Medications Prior to Visit  Medication Sig   fluticasone (FLONASE) 50 MCG/ACT nasal spray Place 2 sprays into both nostrils in the morning and at bedtime. After 7 days, reduce to once daily.   norgestimate-ethinyl estradiol (SPRINTEC 28) 0.25-35 MG-MCG tablet Take 1 tablet by mouth daily.   Nystatin POWD Apply liberally to affected area 2 times per day   spironolactone (ALDACTONE) 100 MG tablet Take 1 tablet (100 mg total) by mouth daily.   No facility-administered medications prior to visit.    ROS        Objective:     There were no vitals taken for this visit. {Vitals History (Optional):23777}  Physical Exam   No results found for any visits on 05/26/22. {Show previous labs (optional):23779}    Assessment & Plan:    Routine Health Maintenance and Physical Exam  Immunization History  Administered Date(s) Administered   DTaP 10/27/1999, 12/23/1999,  03/02/2000, 11/16/2000, 06/01/2004   Hepatitis A 03/28/2008, 04/03/2009   Hepatitis B 08/14/1999, 09/21/1999, 03/02/2000   HiB (PRP-OMP) 10/27/1999, 12/23/1999, 03/02/2000, 11/16/2000   IPV 10/27/1999, 12/23/1999, 08/21/2000, 06/01/2004   Influenza,inj,Quad PF,6+ Mos 07/04/2014   Influenza-Unspecified 10/03/2012   MMR 08/21/2001, 06/01/2004   Meningococcal Polysaccharide 04/02/2012   Pneumococcal Conjugate-13 11/16/2000   Pneumococcal-Unspecified 03/02/2000, 05/16/2000   Tdap 04/02/2012   Varicella 08/21/2000, 03/28/2008    Health Maintenance  Topic Date Due   HIV Screening  Never done   Hepatitis C Screening  Never done   INFLUENZA VACCINE  05/24/2022   PAP-Cervical Cytology Screening  05/26/2022 (Originally 08/12/2020)   PAP SMEAR-Modifier  05/26/2022 (Originally 08/12/2020)   TETANUS/TDAP  05/26/2022 (Originally 04/02/2022)   HPV VACCINES  Discontinued   COVID-19 Vaccine  Discontinued    Discussed health benefits of physical activity, and encouraged her to engage in regular exercise appropriate for her age and condition.  Problem List Items Addressed This Visit   None Visit Diagnoses     Annual physical exam    -  Primary   Cervical cancer screening       Need for Tdap vaccination          No follow-ups on file.     Joy Jessup, NP   

## 2013-09-13 ENCOUNTER — Inpatient Hospital Stay (HOSPITAL_COMMUNITY)
Admission: AD | Admit: 2013-09-13 | Discharge: 2013-09-13 | Disposition: A | Discharge status: Home / Self Care | Payer: Medicaid Other | Source: Ambulatory Visit | Attending: Obstetrics & Gynecology | Admitting: Obstetrics & Gynecology

## 2013-09-13 ENCOUNTER — Encounter (HOSPITAL_COMMUNITY): Payer: Self-pay | Admitting: *Deleted

## 2013-09-13 DIAGNOSIS — G5712 Meralgia paresthetica, left lower limb: Secondary | ICD-10-CM

## 2013-09-13 DIAGNOSIS — R109 Unspecified abdominal pain: Secondary | ICD-10-CM | POA: Insufficient documentation

## 2013-09-13 DIAGNOSIS — O99891 Other specified diseases and conditions complicating pregnancy: Secondary | ICD-10-CM | POA: Insufficient documentation

## 2013-09-13 DIAGNOSIS — O34599 Maternal care for other abnormalities of gravid uterus, unspecified trimester: Secondary | ICD-10-CM | POA: Insufficient documentation

## 2013-09-13 DIAGNOSIS — N83209 Unspecified ovarian cyst, unspecified side: Secondary | ICD-10-CM | POA: Insufficient documentation

## 2013-09-13 DIAGNOSIS — Z87891 Personal history of nicotine dependence: Secondary | ICD-10-CM | POA: Insufficient documentation

## 2013-09-13 LAB — URINALYSIS, ROUTINE W REFLEX MICROSCOPIC
Bilirubin Urine: NEGATIVE
Hgb urine dipstick: NEGATIVE
Nitrite: NEGATIVE
Protein, ur: NEGATIVE mg/dL
Urobilinogen, UA: 0.2 mg/dL (ref 0.0–1.0)

## 2013-09-13 MED ORDER — TRAMADOL HCL 50 MG PO TABS: 50.0000 mg | ORAL_TABLET | Freq: Three times a day (TID) | ORAL | Status: DC | PRN

## 2013-09-13 NOTE — MAU Provider Note (Signed)
History     CSN: [id number]  Arrival date and time: 09/13/13 2001   First Provider Initiated Contact with Patient 09/13/13 2129      Chief Complaint  Patient presents with  . Abdominal Pain   HPI Comments: 22 y.o. G1P0000 at [redacted]w[redacted]d, presents to MAU with chief complaint of "cyst pain." Pt reports known dermoid cyst on [place] earlier this year around the time she found out she was pregnant, with repeat US unchanged in Sept. Pt describes sharp left side/flank and hip/groin pain, constantly every day for about 2 weeks. Pain is vaguely described as worse with urination but urination itself is not painful and pt has no increased frequency/urge or burning on urination. Pain does radiate to anterior left thigh with a "shocking" sensation. Denies pain shooting down leg to knee or ankle.   Denies other pain. Denies fever/chills, N/V, other abd pain. Does have occasional headaches, infrequently, and "not bad." Denies change in vision.  Pt takes no medications. Has not tried OTC analgesics. Does not take PNV as they make her constipated.  PNC at [facility], denies complications with pregnancy other than cyst. Does endorse UTI earlier in pregnancy, treated with Keflex. Denies contractions, LOF / gush of fluid, bleeding. Good fetal movements, perhaps some reduced from earlier this week. Some very slight dark brown spotting yesterday, thinks perhaps pieces of mucous plug.    OB History   Grav Para Term Preterm Abortions TAB SAB Ect Mult Living   1 0 0 0 0 0 0 0 0 0       Past Medical History  Diagnosis Date  . No pertinent past medical history   . Bacterial vaginitis     Past Surgical History  Procedure Laterality Date  . No past surgeries      Family History  Problem Relation Age of Onset  . Hypertension Mother   . Hypertension Maternal Grandmother     History  Substance Use Topics  . Smoking status: Former Smoker -- 0.25 packs/day    Quit date: 12/31/2012  . Smokeless tobacco: Never Used  .  Alcohol Use: No     Comment: social    Allergies:  Allergies  Allergen Reactions  . Latex Itching  . Other Hives    mushrooms    No prescriptions prior to admission    ROS: As above.  Physical Exam   Blood pressure 116/60, pulse 101, temperature 98.4 F (36.9 C), resp. rate 20, height 5\' 7"  (1.702 m), weight 86.909 kg (191 lb 9.6 oz), last menstrual period 12/16/2012.  Physical Exam  Nursing note and vitals reviewed. Constitutional: She is oriented to person, place, and time. She appears well-developed and well-nourished.  HENT:  Head: Normocephalic and atraumatic.  Eyes: EOM are normal.  Neck: Normal range of motion. Neck supple.  Cardiovascular: Normal rate, regular rhythm and normal heart sounds.   No murmur heard. Respiratory: Effort normal and breath sounds normal. No respiratory distress.  GI: Soft. Bowel sounds are normal.  Gravid. Some tenderness to left flank and in left groin, not frankly tender in abdomen  Genitourinary: Vagina normal.  Dilation: Fingertip Effacement (%): 50 Station: -3 Exam by:: Street MD  Musculoskeletal: Normal range of motion.       Right hip: She exhibits normal range of motion and no tenderness.       Left hip: She exhibits tenderness. She exhibits normal range of motion.       Right knee: She exhibits normal range of motion. No tenderness found.  Left knee: She exhibits normal range of motion. No tenderness found.       Lumbar back: She exhibits no tenderness.  No CVA tenderness. Negative radiculopathy on straight-leg lift test. Some tenderness in and around left hip but normal ROM. Some anterior thigh pain with flexion at hip.  Neurological: She is alert and oriented to person, place, and time.  Skin: Skin is warm and dry. No erythema.  Psychiatric: She has a normal mood and affect. Her behavior is normal.    MAU Course  Procedures  MDM UA unremarkable. Cervical exam above. Does not appear to be in active  labor. Reassuring FHR tracing. Few irregular contractions on tocometry.  Assessment and Plan  -SIUP at [redacted]w[redacted]d, not in labor, urine  -expect current pain may represent meralgia paraesthetica / pinched nerve from gravid abdomen +/- pain from known cyst -UA not suggestive of infection, no regular contractions, FHR tracing category I -Rx for tramadol and instructions to f/u in clinic next week -reviewed reasons to return to MAU, including s/s of systemic infection, labor, reduced fetal movement, etc  Above discussed with K. Clelia Croft, CNM.  Bobbye Morton, MD PGY-2, [facility] 09/13/2013, 10:01 PM  I have participated in the care of this patient and I agree with the above. SHAW, KIMBERLY 2:02 PM 09/15/2013

## 2013-09-13 NOTE — MAU Note (Signed)
I've had a cyst on my ovary since early pregnancy and it has been hurting for the past wk. I told my doctor in clinic on Weds.

## 2013-09-15 ENCOUNTER — Encounter (HOSPITAL_COMMUNITY): Payer: Self-pay | Admitting: *Deleted

## 2013-09-15 ENCOUNTER — Inpatient Hospital Stay (HOSPITAL_COMMUNITY)
Admission: AD | Admit: 2013-09-15 | Discharge: 2013-09-17 | DRG: 775 | Disposition: A | Discharge status: Home / Self Care | Payer: Medicaid Other | Source: Ambulatory Visit | Attending: Obstetrics & Gynecology | Admitting: Obstetrics & Gynecology

## 2013-09-15 DIAGNOSIS — O99892 Other specified diseases and conditions complicating childbirth: Secondary | ICD-10-CM | POA: Diagnosis present

## 2013-09-15 DIAGNOSIS — O4290 Premature rupture of membranes, unspecified as to length of time between rupture and onset of labor, unspecified weeks of gestation: Principal | ICD-10-CM | POA: Diagnosis present

## 2013-09-15 DIAGNOSIS — D279 Benign neoplasm of unspecified ovary: Secondary | ICD-10-CM | POA: Diagnosis present

## 2013-09-15 DIAGNOSIS — Z22330 Carrier of Group B streptococcus: Secondary | ICD-10-CM

## 2013-09-15 DIAGNOSIS — O34599 Maternal care for other abnormalities of gravid uterus, unspecified trimester: Secondary | ICD-10-CM | POA: Diagnosis present

## 2013-09-15 LAB — CBC
HCT: 31 % — ABNORMAL LOW (ref 36.0–46.0)
MCH: 26.5 pg (ref 26.0–34.0)
MCHC: 33.2 g/dL (ref 30.0–36.0)
MCV: 79.9 fL (ref 78.0–100.0)
Platelets: 290 10*3/uL (ref 150–400)
RDW: 14.2 % (ref 11.5–15.5)
WBC: 8.9 10*3/uL (ref 4.0–10.5)

## 2013-09-15 MED ORDER — ONDANSETRON HCL 4 MG/2ML IJ SOLN: 4.0000 mg | Freq: Four times a day (QID) | INTRAMUSCULAR | Status: DC | PRN

## 2013-09-15 MED ORDER — IBUPROFEN 600 MG PO TABS: 600.0000 mg | ORAL_TABLET | Freq: Four times a day (QID) | ORAL | Status: DC | PRN

## 2013-09-15 MED ORDER — LACTATED RINGERS IV SOLN
INTRAVENOUS | Status: DC
  Administered 2013-09-15: 17:00:00 via INTRAVENOUS

## 2013-09-15 MED ORDER — ACETAMINOPHEN 325 MG PO TABS: 650.0000 mg | ORAL_TABLET | ORAL | Status: DC | PRN

## 2013-09-15 MED ORDER — LACTATED RINGERS IV SOLN: 500.0000 mL | INTRAVENOUS | Status: DC | PRN

## 2013-09-15 MED ORDER — OXYCODONE-ACETAMINOPHEN 5-325 MG PO TABS: 1.0000 | ORAL_TABLET | ORAL | Status: DC | PRN

## 2013-09-15 MED ORDER — FENTANYL CITRATE 0.05 MG/ML IJ SOLN
100.0000 ug | INTRAMUSCULAR | Status: DC | PRN
  Administered 2013-09-15 (×2): 100 ug via INTRAVENOUS
  Filled 2013-09-15 (×2): qty 2

## 2013-09-15 MED ORDER — OXYTOCIN BOLUS FROM INFUSION
500.0000 mL | INTRAVENOUS | Status: DC
  Administered 2013-09-16: 500 mL via INTRAVENOUS

## 2013-09-15 MED ORDER — LIDOCAINE HCL (PF) 1 % IJ SOLN
30.0000 mL | INTRAMUSCULAR | Status: DC | PRN
  Filled 2013-09-15 (×2): qty 30

## 2013-09-15 MED ORDER — PENICILLIN G POTASSIUM 5000000 UNITS IJ SOLR
2.5000 10*6.[IU] | INTRAVENOUS | Status: DC
  Administered 2013-09-15 – 2013-09-16 (×2): 2.5 10*6.[IU] via INTRAVENOUS
  Filled 2013-09-15 (×6): qty 2.5

## 2013-09-15 MED ORDER — TERBUTALINE SULFATE 1 MG/ML IJ SOLN: 0.2500 mg | Freq: Once | INTRAMUSCULAR | Status: AC | PRN

## 2013-09-15 MED ORDER — CITRIC ACID-SODIUM CITRATE 334-500 MG/5ML PO SOLN: 30.0000 mL | ORAL | Status: DC | PRN

## 2013-09-15 MED ORDER — PENICILLIN G POTASSIUM 5000000 UNITS IJ SOLR
5.0000 10*6.[IU] | Freq: Once | INTRAVENOUS | Status: AC
  Administered 2013-09-15: 5 10*6.[IU] via INTRAVENOUS
  Filled 2013-09-15: qty 5

## 2013-09-15 MED ORDER — ZOLPIDEM TARTRATE 5 MG PO TABS: 5.0000 mg | ORAL_TABLET | Freq: Every evening | ORAL | Status: DC | PRN

## 2013-09-15 MED ORDER — OXYTOCIN 40 UNITS IN LACTATED RINGERS INFUSION - SIMPLE MED
62.5000 mL/h | INTRAVENOUS | Status: DC
  Administered 2013-09-16: 62.5 mL/h via INTRAVENOUS
  Filled 2013-09-15: qty 1000

## 2013-09-15 MED ORDER — MISOPROSTOL 200 MCG PO TABS
50.0000 ug | ORAL_TABLET | ORAL | Status: DC
  Administered 2013-09-15: 50 ug via ORAL
  Filled 2013-09-15: qty 0.5

## 2013-09-15 NOTE — Progress Notes (Addendum)
Rebecca Leach is a 22 y.o. G1P0000 at [redacted]w[redacted]d admitted for PROM  Subjective: Uncomfortable with contractions, but otherwise denies complaints.  Objective: BP 122/77  Pulse 92  Temp(Src) 97.8 F (36.6 C) (Oral)  Resp 18  Ht 5\' 7"  (1.702 m)  Wt 87.091 kg (192 lb)  BMI 30.06 kg/m2  LMP 12/16/2012      FHT:  FHR: 130 bpm, variability: moderate,  accelerations:  Present,  decelerations:  Absent UC:   regular, every 2-3 minutes SVE:   Dilation: 2 Effacement (%): 70 Station: -2 Exam by:: Dr. Casper Harrison  Labs: Lab Results  Component Value Date   WBC 8.9 09/15/2013   HGB 10.3* 09/15/2013   HCT 31.0* 09/15/2013   MCV 79.9 09/15/2013   PLT 290 09/15/2013    Assessment / Plan: Induction of labor due to PROM. Foley bulb placed 2150.  Labor: Await bulb ripening, then start Pit Preeclampsia:  no signs or symptoms of toxicity Fetal Wellbeing:  Category I Pain Control:  Fentanyl and planning epidural I/D:  PCN for GBS positive Anticipated MOD:  NSVD  Above discussed with / supervised by Roney Marion, CNM.  Bobbye Morton, MD PGY-2, [facility] 09/15/2013, 9:59 PM

## 2013-09-15 NOTE — Progress Notes (Signed)
Late entry of note. Clarification of last labor progress note; foley bulb placed due to pt contracting too much for second dose of Cytotec. Will continue to monitor. FHR tracing continues to be reassuring.  Bobbye Morton, MD PGY-2, [facility] 09/15/2013, 10:53 PM

## 2013-09-15 NOTE — Progress Notes (Signed)
Notified pt grossly ruptured and sent to room 173 on birthing suites.

## 2013-09-15 NOTE — H&P (Signed)
Rebecca Leach is a 22 y.o. female G1 at 39.0 by LMP c/w 11wk U/S presenting for PROM at approx 3pm. Denies painful ctx or bldg. Saw clear fluid. Her preg has been followed by the [facility] with onset of care at 17wks. Her preg has been remarkable for 1) 5cm L dermoid cyst (will do cystectomy if requires C/S) 2) GBS in urine. History OB History   Grav Para Term Preterm Abortions TAB SAB Ect Mult Living   1 0 0 0 0 0 0 0 0 0      Past Medical History  Diagnosis Date  . No pertinent past medical history   . Bacterial vaginitis    Past Surgical History  Procedure Laterality Date  . No past surgeries     Family History: family history includes Hypertension in her maternal grandmother and mother. Social History:  reports that she quit smoking about 8 months ago. She has never used smokeless tobacco. She reports that she does not drink alcohol or use illicit drugs.   Prenatal Transfer Tool  Maternal Diabetes: No Genetic Screening: Normal Maternal Ultrasounds/Referrals: Normal Fetal Ultrasounds or other Referrals:  None Maternal Substance Abuse:  No Significant Maternal Medications:  None Significant Maternal Lab Results:  Lab values include: Group B Strep positive Other Comments:  None  ROS  Dilation: 1 Effacement (%): 50 Station: -1 Exam by:: kim shaw cnm Blood pressure 122/77, pulse 89, temperature 98.5 F (36.9 C), temperature source Oral, resp. rate 18, last menstrual period 12/16/2012. Exam Physical Exam  Constitutional: She is oriented to person, place, and time. She appears well-developed.  HENT:  Head: Normocephalic.  Neck: Normal range of motion.  Cardiovascular: Normal rate.   Respiratory: Effort normal.  GI:  FHR 120-130, +accels, no decels Ctx irreg, mild  Genitourinary: Vagina normal.  Cx 1/50/-1, copious clear fluid  Musculoskeletal: Normal range of motion.  Neurological: She is alert and oriented to person, place, and time.  Skin: Skin is warm and dry.   Psychiatric: She has a normal mood and affect. Her behavior is normal. Thought content normal.    Prenatal labs: ABO, Rh: O/POS/-- (06/25 1107) Antibody: NEG (06/25 1107) Rubella: 2.90 (06/25 1107) RPR: NON REAC (09/11 1347)  HBsAg: NEGATIVE (06/25 1107)  HIV: NON REACTIVE (09/11 1347)  GBS:     Assessment/Plan: IUP at 39.0wks PROM GBS positive  Admit to [facility] for GBS prophylaxis Will use PO cytotec for cx ripening Anticipate SVD (Eval dermoid if C/S is delivery mode)    SHAW, KIMBERLY 09/15/2013, 4:52 PM

## 2013-09-15 NOTE — H&P (Signed)
Attestation of Attending Supervision of Advanced Practitioner (CNM/NP): Evaluation and management procedures were performed by the Advanced Practitioner under my supervision and collaboration. I have reviewed the Advanced Practitioner's note and chart, and I agree with the management and plan.  Tyrisha Benninger H. 5:49 PM   

## 2013-09-16 ENCOUNTER — Inpatient Hospital Stay (HOSPITAL_COMMUNITY): Payer: Medicaid Other | Admitting: Anesthesiology

## 2013-09-16 ENCOUNTER — Encounter (HOSPITAL_COMMUNITY): Payer: Medicaid Other | Admitting: Anesthesiology

## 2013-09-16 ENCOUNTER — Encounter (HOSPITAL_COMMUNITY): Payer: Self-pay | Admitting: Anesthesiology

## 2013-09-16 DIAGNOSIS — D279 Benign neoplasm of unspecified ovary: Secondary | ICD-10-CM

## 2013-09-16 DIAGNOSIS — O4290 Premature rupture of membranes, unspecified as to length of time between rupture and onset of labor, unspecified weeks of gestation: Secondary | ICD-10-CM

## 2013-09-16 DIAGNOSIS — O34599 Maternal care for other abnormalities of gravid uterus, unspecified trimester: Secondary | ICD-10-CM

## 2013-09-16 MED ORDER — ONDANSETRON HCL 4 MG PO TABS: 4.0000 mg | ORAL_TABLET | ORAL | Status: DC | PRN

## 2013-09-16 MED ORDER — DIPHENHYDRAMINE HCL 25 MG PO CAPS: 25.0000 mg | ORAL_CAPSULE | Freq: Four times a day (QID) | ORAL | Status: DC | PRN

## 2013-09-16 MED ORDER — EPHEDRINE 5 MG/ML INJ
10.0000 mg | INTRAVENOUS | Status: DC | PRN
  Filled 2013-09-16: qty 2

## 2013-09-16 MED ORDER — DIBUCAINE 1 % RE OINT: 1.0000 "application " | TOPICAL_OINTMENT | RECTAL | Status: DC | PRN

## 2013-09-16 MED ORDER — DIPHENHYDRAMINE HCL 50 MG/ML IJ SOLN: 12.5000 mg | INTRAMUSCULAR | Status: DC | PRN

## 2013-09-16 MED ORDER — SIMETHICONE 80 MG PO CHEW: 80.0000 mg | CHEWABLE_TABLET | ORAL | Status: DC | PRN

## 2013-09-16 MED ORDER — LANOLIN HYDROUS EX OINT: TOPICAL_OINTMENT | CUTANEOUS | Status: DC | PRN

## 2013-09-16 MED ORDER — WITCH HAZEL-GLYCERIN EX PADS: 1.0000 "application " | MEDICATED_PAD | CUTANEOUS | Status: DC | PRN

## 2013-09-16 MED ORDER — ZOLPIDEM TARTRATE 5 MG PO TABS: 5.0000 mg | ORAL_TABLET | Freq: Every evening | ORAL | Status: DC | PRN

## 2013-09-16 MED ORDER — EPHEDRINE 5 MG/ML INJ
10.0000 mg | INTRAVENOUS | Status: DC | PRN
  Filled 2013-09-16: qty 4
  Filled 2013-09-16: qty 2

## 2013-09-16 MED ORDER — TETANUS-DIPHTH-ACELL PERTUSSIS 5-2.5-18.5 LF-MCG/0.5 IM SUSP: 0.5000 mL | Freq: Once | INTRAMUSCULAR | Status: DC

## 2013-09-16 MED ORDER — LACTATED RINGERS IV SOLN
500.0000 mL | Freq: Once | INTRAVENOUS | Status: AC
  Administered 2013-09-16: 500 mL via INTRAVENOUS

## 2013-09-16 MED ORDER — PHENYLEPHRINE 40 MCG/ML (10ML) SYRINGE FOR IV PUSH (FOR BLOOD PRESSURE SUPPORT)
80.0000 ug | PREFILLED_SYRINGE | INTRAVENOUS | Status: DC | PRN
  Filled 2013-09-16: qty 2
  Filled 2013-09-16: qty 10

## 2013-09-16 MED ORDER — ONDANSETRON HCL 4 MG/2ML IJ SOLN: 4.0000 mg | INTRAMUSCULAR | Status: DC | PRN

## 2013-09-16 MED ORDER — FENTANYL 2.5 MCG/ML BUPIVACAINE 1/10 % EPIDURAL INFUSION (WH - ANES)
14.0000 mL/h | INTRAMUSCULAR | Status: DC | PRN
  Filled 2013-09-16: qty 125

## 2013-09-16 MED ORDER — SENNOSIDES-DOCUSATE SODIUM 8.6-50 MG PO TABS
2.0000 | ORAL_TABLET | ORAL | Status: DC
  Administered 2013-09-17: 2 via ORAL
  Filled 2013-09-16: qty 2

## 2013-09-16 MED ORDER — PHENYLEPHRINE 40 MCG/ML (10ML) SYRINGE FOR IV PUSH (FOR BLOOD PRESSURE SUPPORT)
80.0000 ug | PREFILLED_SYRINGE | INTRAVENOUS | Status: DC | PRN
  Filled 2013-09-16: qty 2

## 2013-09-16 MED ORDER — LIDOCAINE HCL (PF) 1 % IJ SOLN
INTRAMUSCULAR | Status: DC | PRN
  Administered 2013-09-16 (×2): 4 mL

## 2013-09-16 MED ORDER — OXYCODONE-ACETAMINOPHEN 5-325 MG PO TABS
1.0000 | ORAL_TABLET | ORAL | Status: DC | PRN
  Administered 2013-09-16: 1 via ORAL
  Filled 2013-09-16: qty 1

## 2013-09-16 MED ORDER — PRENATAL MULTIVITAMIN CH
1.0000 | ORAL_TABLET | Freq: Every day | ORAL | Status: DC
  Administered 2013-09-16 – 2013-09-17 (×2): 1 via ORAL
  Filled 2013-09-16 (×2): qty 1

## 2013-09-16 MED ORDER — FENTANYL 2.5 MCG/ML BUPIVACAINE 1/10 % EPIDURAL INFUSION (WH - ANES)
INTRAMUSCULAR | Status: DC | PRN
  Administered 2013-09-16: 14 mL/h via EPIDURAL

## 2013-09-16 MED ORDER — BENZOCAINE-MENTHOL 20-0.5 % EX AERO
1.0000 "application " | INHALATION_SPRAY | CUTANEOUS | Status: DC | PRN
  Administered 2013-09-16: 1 via TOPICAL
  Filled 2013-09-16: qty 56

## 2013-09-16 MED ORDER — IBUPROFEN 600 MG PO TABS
600.0000 mg | ORAL_TABLET | Freq: Four times a day (QID) | ORAL | Status: DC
  Administered 2013-09-16 – 2013-09-17 (×5): 600 mg via ORAL
  Filled 2013-09-16 (×6): qty 1

## 2013-09-16 NOTE — Anesthesia Preprocedure Evaluation (Signed)

## 2013-09-16 NOTE — MAU Provider Note (Signed)
Attestation of Attending Supervision of Advanced Practitioner (CNM/NP): Evaluation and management procedures were performed by the Advanced Practitioner under my supervision and collaboration.  I have reviewed the Advanced Practitioner's note and chart, and I agree with the management and plan.  HARRAWAY-SMITH, Demetries Coia 3:54 PM

## 2013-09-16 NOTE — Anesthesia Procedure Notes (Signed)
Epidural Patient location during procedure: OB Start time: 09/16/2013 1:25 AM  Staffing Anesthesiologist: Wiliam Cauthorn A. Performed by: anesthesiologist   Preanesthetic Checklist Completed: patient identified, site marked, surgical consent, pre-op evaluation, timeout performed, IV checked, risks and benefits discussed and monitors and equipment checked  Epidural Patient position: sitting Prep: site prepped and draped and DuraPrep Patient monitoring: continuous pulse ox and blood pressure Approach: midline Injection technique: LOR air  Needle:  Needle type: Tuohy  Needle gauge: 17 G Needle length: 9 cm and 9 Needle insertion depth: 5 cm cm Catheter type: closed end flexible Catheter size: 19 Gauge Catheter at skin depth: 10 cm Test dose: negative and Other  Assessment Events: blood not aspirated, injection not painful, no injection resistance, negative IV test and no paresthesia  Additional Notes Patient identified. Risks and benefits discussed including failed block, incomplete  Pain control, post dural puncture headache, nerve damage, paralysis, blood pressure Changes, nausea, vomiting, reactions to medications-both toxic and allergic and post Partum back pain. All questions were answered. Patient expressed understanding and wished to proceed. Sterile technique was used throughout procedure. Epidural site was Dressed with sterile barrier dressing. No paresthesias, signs of intravascular injection Or signs of intrathecal spread were encountered.  Patient was more comfortable after the epidural was dosed. Please see RN's note for documentation of vital signs and FHR which are stable.

## 2013-09-16 NOTE — Anesthesia Postprocedure Evaluation (Signed)
Anesthesia Post Note  Patient: Rebecca Leach  Procedure(s) Performed: * No procedures listed *  Anesthesia type: Epidural  Patient location: Mother/Baby  Post pain: Pain level controlled  Post assessment: Post-op Vital signs reviewed  Last Vitals:  Filed Vitals:   09/16/13 0648  BP: 115/63  Pulse: 94  Temp: 36.6 C  Resp: 18    Post vital signs: Reviewed  Level of consciousness: awake  Complications: No apparent anesthesia complications

## 2013-09-16 NOTE — Progress Notes (Signed)
UR chart review completed.  

## 2013-09-17 MED ORDER — FERROUS SULFATE 325 (65 FE) MG PO TABS: 325.0000 mg | ORAL_TABLET | Freq: Every day | ORAL | Status: DC

## 2013-09-17 MED ORDER — IBUPROFEN 600 MG PO TABS: 600.0000 mg | ORAL_TABLET | Freq: Four times a day (QID) | ORAL | Status: AC

## 2013-09-17 MED ORDER — SENNOSIDES-DOCUSATE SODIUM 8.6-50 MG PO TABS: 2.0000 | ORAL_TABLET | ORAL | Status: DC

## 2013-09-17 NOTE — Discharge Summary (Signed)
Obstetric Discharge Summary Reason for Admission: PROM Prenatal Procedures: ultrasound Intrapartum Procedures: spontaneous vaginal delivery and GBS prophylaxis Postpartum Procedures: none Complications-Operative and Postpartum: 1st degree vaginal laceration Hemoglobin  Date Value Range Status  09/15/2013 10.3* 12.0 - 15.0 g/dL Final     HCT  Date Value Range Status  09/15/2013 31.0* 36.0 - 46.0 % Final  Brief Hospital Course:  Rebecca Leach is a 22 y.o. now G1P1001 who presented 11/23 after PROM, and went on to a NSVD at [redacted]w[redacted]d.   She was followed by the [facility] beginning at 17wks. PCN G was given for GBS prophylaxis. She has a 5cm L dermoid cyst that was not evaluated during this admission, but this will need follow up in the gyn clinic. She will also follow up in [facility]. She will continue breast feeding and desires mirena for contraception.   Physical Exam:  General: alert, cooperative and no distress Lochia: appropriate Uterine Fundus: firm Incision: N/A DVT Evaluation: No evidence of DVT seen on physical exam. Negative Homan's sign. No significant calf/ankle edema.  Discharge Diagnoses: Term Pregnancy-delivered and PROM, GBS infection (treated)  Discharge Information: Date: 09/17/2013 Activity: pelvic rest Diet: routine Medications: Ibuprofen, Iron and senokot Condition: stable Instructions: refer to practice specific booklet Discharge to: home Follow-up Information   Follow up with [facility] In 6 weeks.   Specialty:  Obstetrics and Gynecology   Contact information:   [address] [place] [address] [phone]      Newborn Data: Live born female  Birth Weight: 6 lb 8.6 oz (2965 g) APGAR: 6, 9  Home with mother.  Rebecca Leach 09/17/2013, 8:02 AM  I have seen and examined this patient and agree with above documentation in the resident's note.   Rulon Abide, M.D. [facility] Fellow 09/17/2013 4:44 PM

## 2013-09-17 NOTE — Lactation Note (Signed)
This note was copied from the chart of Rebecca Leach. Lactation Consultation Note  Patient Name: Rebecca Meeya Goldin [id number] Date: 09/17/2013 Reason for consult: Follow-up assessment;Difficult latch;Other (Comment) (flat nipples , see LC note ) LC Stevan Born  Gave report to this [facility] - reporting request for [facility] loaner faxed to [facility]. While  LC in the room - [facility] peer  counselor called mom in the room - scheduled [facility] loaner pump pick up for  130p tomorrow afternoon. Per mom during the night tried latching with nipple shield and SNS , mom reports it got messy. Per mom prefers to pump and bottle feed . LC reviewed set up for DEBP and showed mom how to hold bottles and make sure The flanges were centered. #24 Flange appears to be a good fit and mom reports it is comfortable. Reviewed supply and demand - and stressed to mom in order to establish and protect milk supply pumping every 3 hours ( at least 8 X's  In 24 hours ) would be indicated and important. Also encouraged to wear shells due to flat nipples so the flanges for pumping would be more  Comfortable until the base of the nipple became for flexible. Mom receptive to teaching, and grandma plans to help mom out with feedings.    Maternal Data    Feeding    LATCH Score/Interventions Latch:  (per mom wants to pump and bottle feed )              Intervention(s): Breastfeeding basics reviewed     Lactation Tools Discussed/Used Tools: Pump Breast pump type: Double-Electric Breast Pump WIC Program: Yes (WIC notified mom via phone while LC in the room ) Pump Review:  (reviewed )   Consult Status Consult Status: Follow-up Date: 09/18/13 Follow-up type: In-patient    Kathrin Greathouse 09/17/2013, 3:55 PM

## 2013-09-18 ENCOUNTER — Encounter: Payer: Medicaid Other | Admitting: Obstetrics and Gynecology

## 2013-09-18 NOTE — Discharge Summary (Signed)
Attestation of Attending Supervision of Advanced Practitioner (CNM/NP): Evaluation and management procedures were performed by the Advanced Practitioner under my supervision and collaboration.  I have reviewed the Advanced Practitioner's note and chart, and I agree with the management and plan.  Kallista Pae 09/18/2013 9:36 AM

## 2013-09-25 ENCOUNTER — Encounter: Payer: Self-pay | Admitting: Obstetrics and Gynecology

## 2013-10-01 ENCOUNTER — Encounter: Payer: Self-pay | Admitting: General Practice

## 2013-10-14 ENCOUNTER — Encounter: Payer: Self-pay | Admitting: Obstetrics and Gynecology

## 2013-10-14 ENCOUNTER — Ambulatory Visit (INDEPENDENT_AMBULATORY_CARE_PROVIDER_SITE_OTHER): Payer: Medicaid Other | Admitting: Obstetrics and Gynecology

## 2013-10-14 VITALS — BP 115/75 | HR 76 | Ht 68.0 in | Wt 170.0 lb

## 2013-10-14 DIAGNOSIS — D271 Benign neoplasm of left ovary: Secondary | ICD-10-CM

## 2013-10-14 DIAGNOSIS — D279 Benign neoplasm of unspecified ovary: Secondary | ICD-10-CM

## 2013-10-14 DIAGNOSIS — Z30430 Encounter for insertion of intrauterine contraceptive device: Secondary | ICD-10-CM

## 2013-10-14 DIAGNOSIS — IMO0001 Reserved for inherently not codable concepts without codable children: Secondary | ICD-10-CM

## 2013-10-14 MED ORDER — LEVONORGESTREL 20 MCG/24HR IU IUD
INTRAUTERINE_SYSTEM | Freq: Once | INTRAUTERINE | Status: AC
  Administered 2013-10-14: 14:00:00 1 via INTRAUTERINE

## 2013-10-14 NOTE — Patient Instructions (Signed)
Intrauterine Device Insertion Most often, an intrauterine device (IUD) is inserted into the uterus to prevent pregnancy. There are 2 types of IUDs available:  Copper IUD. This type of IUD creates an environment that is not favorable to sperm survival. The mechanism of action of the copper IUD is not known for certain. It can stay in place for 10 years.  Hormone IUD. This type of IUD contains the hormone progestin (synthetic progesterone). The progestin thickens the cervical mucus and prevents sperm from entering the uterus, and it also thins the uterine lining. There is no evidence that the hormone IUD prevents implantation. The hormone IUD can stay in place for up to 5 years. An IUD is the most cost-effective birth control if left in place for the full duration. It may be removed at any time. LET YOUR CAREGIVER KNOW ABOUT:  Sensitivity to metals.  Medicines taken including herbs, eyedrops, over-the-counter medicines, and creams.  Use of steroids (by mouth or creams).  Previous problems with anesthetics or numbing medicine.  Previous gynecological surgery.  History of blood clots or clotting disorders.  Possibility of pregnancy.  Menstrual irregularities.  Concerns regarding unusual vaginal discharge or odors.  Previous experience with an IUD.  Other health problems. RISKS AND COMPLICATIONS  Accidental puncture (perforation) of the uterus.  Accidental placement of the IUD either in the muscle layer of the uterus (myometrium) or outside the uterus. If this happen, the IUD can be found essentially floating around the bowels. When this happens, the IUD must be taken out surgically.  The IUD may fall out of the uterus (expulsion). This is more common in women who have recently had a child.   Pregnancy in the fallopian tube (ectopic). BEFORE THE PROCEDURE  Schedule the IUD insertion for when you will have your menstrual period or right after, to make sure you are not pregnant.  Placement of the IUD is better tolerated shortly after a menstrual cycle.  You may need to take tests or be examined to make sure you are not pregnant.  You may be required to take a pregnancy test.  You may be required to get checked for sexually transmitted infections (STIs) prior to placement. Placing an IUD in someone who has an infection can make an infection worse.  You may be given a pain reliever to take 1 or 2 hours before the procedure.  An exam will be performed to determine the size and position of your uterus.  Ask your caregiver about changing or stopping your regular medicines. PROCEDURE   A tool (speculum) is placed in the vagina. This allows your caregiver to see the lower part of the uterus (cervix).  The cervix is prepped with a medicine that lowers the risk of infection.  You may be given a medicine to numb each side of the cervix (intracervical or paracervical block). This is used to block and control any discomfort with insertion.  A tool (uterine sound) is inserted into the uterus to determine the length of the uterine cavity and the direction the uterus may be tilted.  A slim instrument (IUD inserter) is inserted through the cervical canal and into your uterus.  The IUD is placed in the uterine cavity and the insertion device is removed.  The nylon string that is attached to the IUD, and used for eventual IUD removal, is trimmed. It is trimmed so that it lays high in the vagina, just outside the cervix. AFTER THE PROCEDURE  You may have bleeding after the   procedure. This is normal. It varies from light spotting for a few days to menstrual-like bleeding.  You may have mild cramping.  Practice checking the string coming out of the cervix to make sure the IUD remains in the uterus. If you cannot feel the string, you should schedule a "string check" with your caregiver.  If you had a hormone IUD inserted, expect that your period may be lighter or nonexistent  within a year's time (though this is not always the case). There may be delayed fertility with the hormone IUD as a result of its progesterone effect. When you are ready to become pregnant, it is suggested to have the IUD removed up to 1 year in advance.  Yearly exams are advised. Document Released: 06/08/2011 Document Revised: 01/02/2012 Document Reviewed: 03/31/2013 ExitCare Patient Information 2014 [employer].  

## 2013-10-14 NOTE — Progress Notes (Addendum)
  Subjective:     JEFFREY VOTH is a 22 y.o. female G1P1001who presents for a postpartum visit. She is 5 weeks postpartum following a spontaneous vaginal delivery. I have fully reviewed the prenatal and intrapartum course. The delivery was at 39 gestational weeks. Outcome: spontaneous vaginal delivery. Anesthesia: epidural. Postpartum course has been uncomplicated. Baby's course has been uncomplicated. Baby is feeding by breast. Bleeding no bleeding. Bowel function is normal. Bladder function is normal. Patient is not sexually active. Contraception method is abstinence. Postpartum depression screening: negative.  The following portions of the patient's history were reviewed and updated as appropriate: allergies, current medications, past family history, past medical history, past social history, past surgical history and problem list.  Review of Systems Pertinent items are noted in HPI.   Objective:    BP 115/75  Pulse 76  Ht 5\' 8"  (1.727 m)  Wt 170 lb (77.111 kg)  BMI 25.85 kg/m2  Breastfeeding? No  General:  alert and cooperative   Breasts:  inspection negative, no nipple discharge or bleeding, no masses or nodularity palpable  Lungs: clear to auscultation bilaterally  Heart:  regular rate and rhythm, S1, S2 normal, no murmur, click, rub or gallop  Abdomen: soft, non-tender; bowel sounds normal; no masses,  no organomegaly   Vulva:  normal  Vagina: normal vagina  Cervix:  no bleeding prior to Mirena insertion  Corpus: normal size, contour, position, consistency, mobility, non-tender  adnexa  fullness left adnexa, NT  Rectal Exam: Not performed.        Assessment:     5 postpartum exam. Pap smear not done at today's visit.  Left ovarian dermoid cyst Plan:    1. Contraception: IUD : see note below 2.Continue PNVs Pelvic US scheduled 3. Follow up in: 1 month or as needed.  for string check and F/U ovarian dermoid cyst   PROCEDURE NOTE  Negative UPT verified, R/B/A  explained and understanding voiced. Time out and onsent obtained. Bimanual done to determine uterus as AV. Speculum inserted, cervix visualized and cleansed with Betadine x 2. Teneculum placed at 10 and 2 on ant lip of cx. Sounded to 10cm. Mirena inserted without difficult and strings cut to 3 cm. Minimal bleeding.  Pt tolerated preocedure well. Danae Orleans, CNM 10/14/2013 5:04 PM

## 2013-10-15 ENCOUNTER — Encounter: Payer: Self-pay | Admitting: *Deleted

## 2013-11-19 ENCOUNTER — Ambulatory Visit (HOSPITAL_COMMUNITY): Admission: RE | Admit: 2013-11-19 | Payer: Medicaid Other | Source: Ambulatory Visit

## 2013-12-10 IMAGING — US US OB TRANSVAGINAL
1 series · 13 of 28 positions shown · non-contrast
Comparison: none

[Series 1: us ob transvaginal · 13 of 28 slices shown]
[im 2/28]
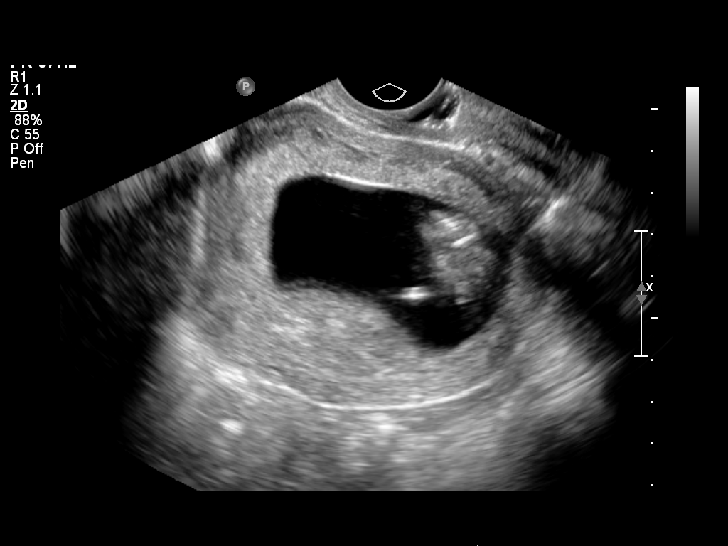
[im 4/28]
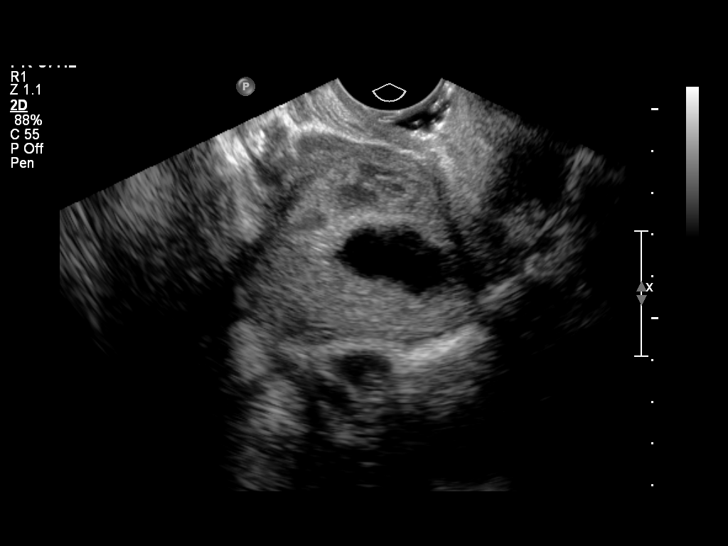
[im 6/28]
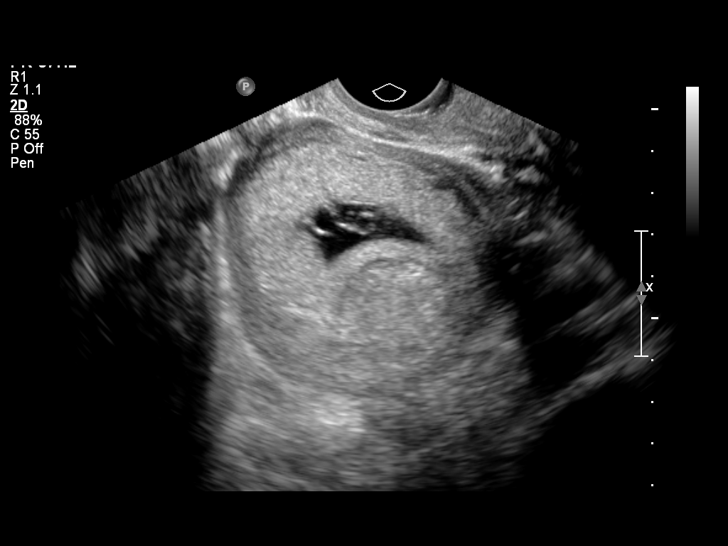
[im 8/28]
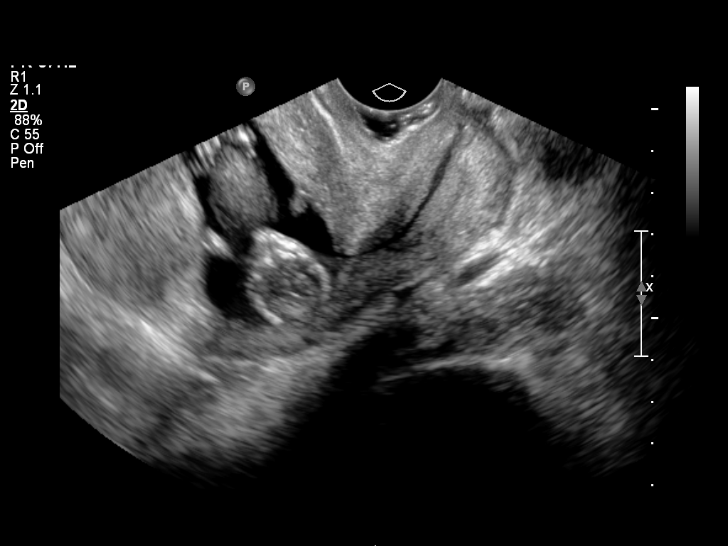
[im 10/28]
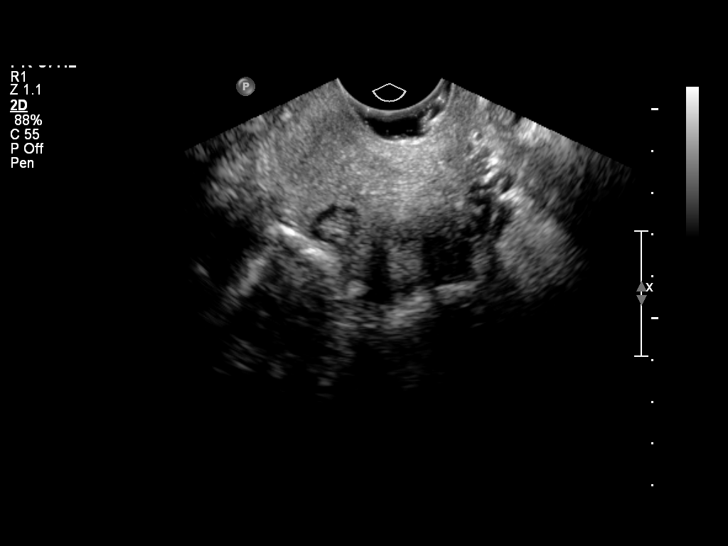
[im 12/28]
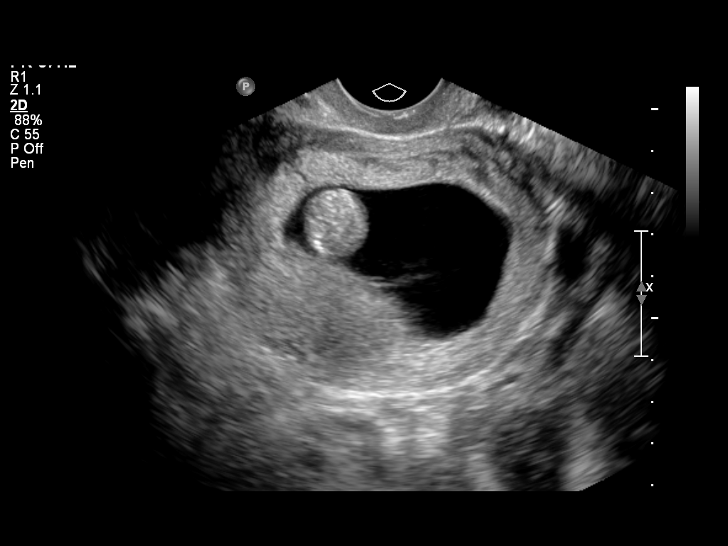
[im 15/28]
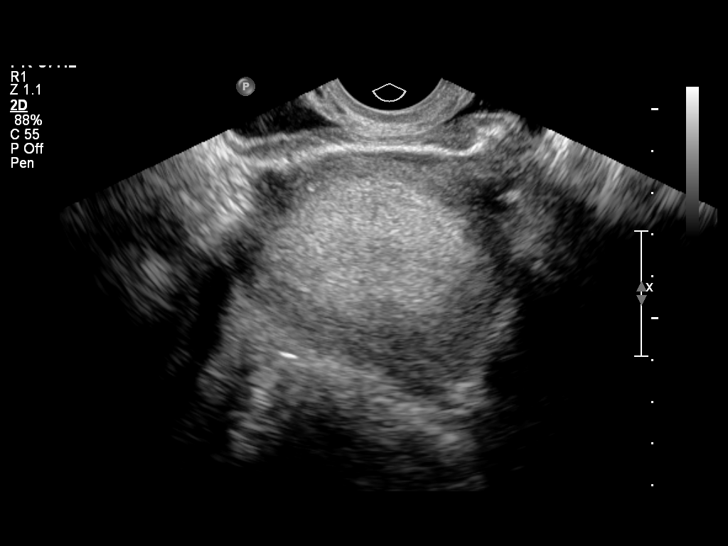
[im 17/28]
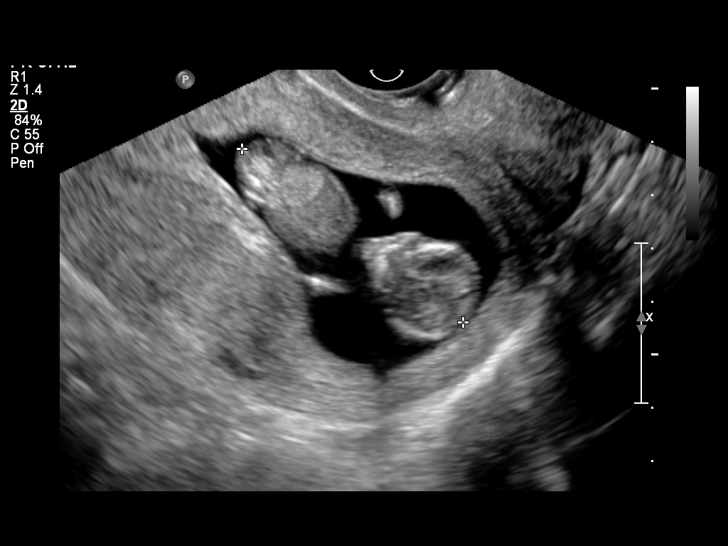
[im 19/28]
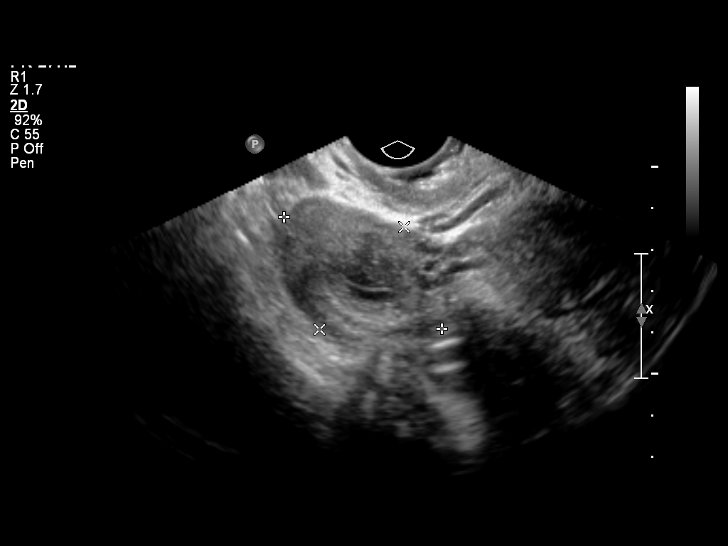
[im 21/28]
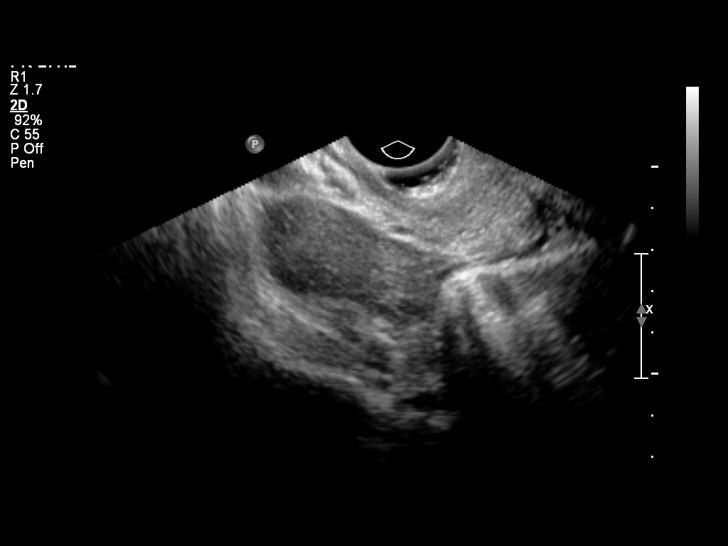
[im 23/28]
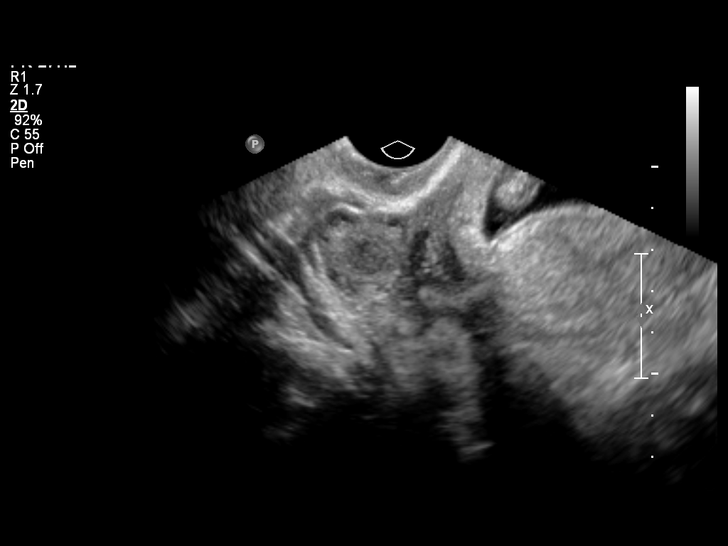
[im 25/28]
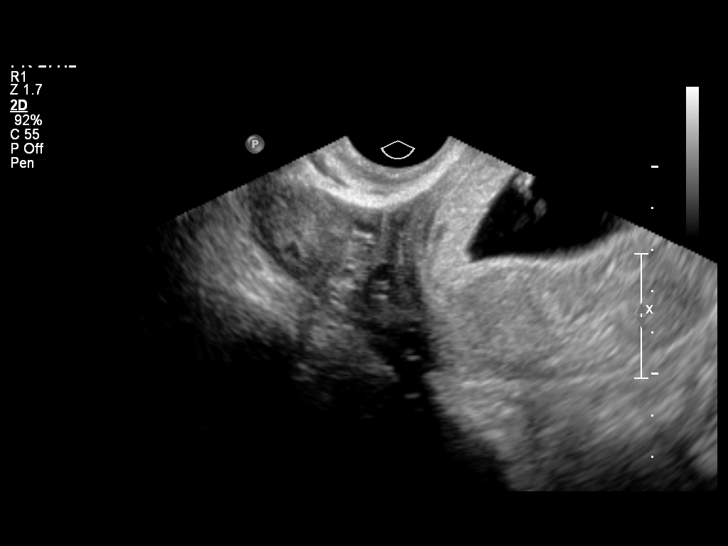
[im 27/28]
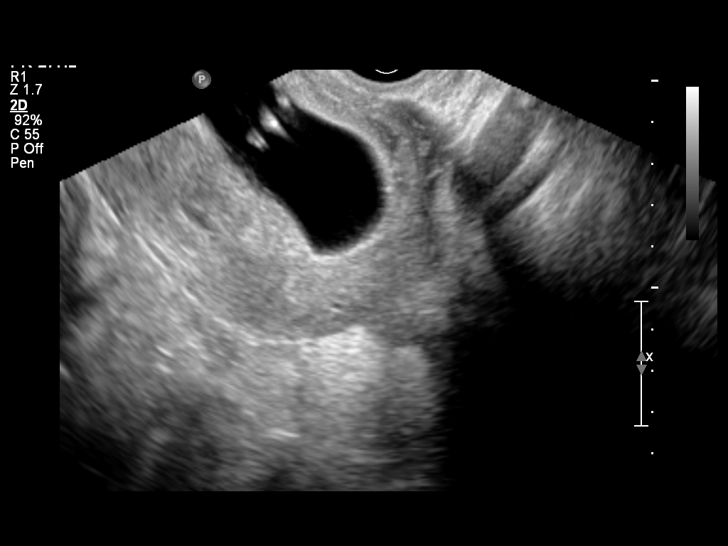

[13 of 28 positions shown; findings below may reference images not displayed]

OBSTETRICS REPORT
                      (Signed Final 03/12/2013 [DATE])

Service(s) Provided

 US OB TRANSVAGINAL                                    76817.0
Indications

 Vaginal bleeding, unknown etiology
 Ovarian / adnexal mass, complex or unspecified
Fetal Evaluation

 Num Of Fetuses:    1
 Gest. Sac:         Intrauterine
 Yolk Sac:          Visualized
 Fetal Pole:        Visualized
 Fetal Heart Rate:  156                         bpm
 Cardiac Activity:  Observed

 Amniotic Fluid
 AFI FV:      Subjectively within normal limits
Biometry

 CRL:     52.4  mm    G. Age:   11w 5d                 EDD:   09/26/13
Gestational Age

 LMP:           12w 2d       Date:   12/16/12                 EDD:   09/22/13
 Best:          12w 2d    Det. By:   LMP  (12/16/12)          EDD:   09/22/13
Anatomy

 Other:  Technically difficult due to early GA.
Cervix Uterus Adnexa

 Uterus:       No abnormality visualized.
 Cul De Sac:   No free fluid seen.
 Left Ovary:   Not well visualized. Previously visualized.
 Right Ovary:  Size(cm) L: 4.67 x W: 2.56 x H: 3.21  Volume(cc):

Impression

 Single live IUP in variable presentation.  Concordant
 measurements/assigned GA by LMP.

## 2013-12-31 ENCOUNTER — Inpatient Hospital Stay (HOSPITAL_COMMUNITY)
Admission: AD | Admit: 2013-12-31 | Discharge: 2014-01-01 | Disposition: A | Discharge status: Home / Self Care | Payer: Medicaid Other | Source: Ambulatory Visit | Attending: Obstetrics & Gynecology | Admitting: Obstetrics & Gynecology

## 2013-12-31 DIAGNOSIS — Z87891 Personal history of nicotine dependence: Secondary | ICD-10-CM | POA: Insufficient documentation

## 2013-12-31 DIAGNOSIS — B9689 Other specified bacterial agents as the cause of diseases classified elsewhere: Secondary | ICD-10-CM | POA: Insufficient documentation

## 2013-12-31 DIAGNOSIS — N760 Acute vaginitis: Secondary | ICD-10-CM | POA: Insufficient documentation

## 2013-12-31 DIAGNOSIS — A499 Bacterial infection, unspecified: Secondary | ICD-10-CM

## 2014-01-01 ENCOUNTER — Encounter (HOSPITAL_COMMUNITY): Payer: Self-pay | Admitting: *Deleted

## 2014-01-01 LAB — URINALYSIS, ROUTINE W REFLEX MICROSCOPIC
Bilirubin Urine: NEGATIVE
Glucose, UA: NEGATIVE mg/dL
KETONES UR: NEGATIVE mg/dL
LEUKOCYTES UA: NEGATIVE
NITRITE: NEGATIVE
PROTEIN: NEGATIVE mg/dL
Specific Gravity, Urine: >1.03 — ABNORMAL HIGH (ref 1.005–1.030)
Urobilinogen, UA: 0.2 mg/dL (ref 0.0–1.0)
pH: 6 (ref 5.0–8.0)

## 2014-01-01 LAB — URINE MICROSCOPIC-ADD ON

## 2014-01-01 LAB — WET PREP, GENITAL
Trich, Wet Prep: NONE SEEN
Yeast Wet Prep HPF POC: NONE SEEN

## 2014-01-01 LAB — GC/CHLAMYDIA PROBE AMP
CT Probe RNA: NEGATIVE
GC PROBE AMP APTIMA: NEGATIVE

## 2014-01-01 LAB — POCT PREGNANCY, URINE: Preg Test, Ur: NEGATIVE

## 2014-01-01 MED ORDER — METRONIDAZOLE 500 MG PO TABS: 500.0000 mg | ORAL_TABLET | Freq: Two times a day (BID) | ORAL | Status: DC

## 2014-01-01 NOTE — MAU Provider Note (Signed)
Attestation of Attending Supervision of Advanced Practitioner (CNM/NP): Evaluation and management procedures were performed by the Advanced Practitioner under my supervision and collaboration.  I have reviewed the Advanced Practitioner's note and chart, and I agree with the management and plan.  HARRAWAY-SMITH, Deneen Slager 6:47 AM

## 2014-01-01 NOTE — Progress Notes (Signed)
Pelvic exam performed a 0050

## 2014-01-01 NOTE — MAU Provider Note (Signed)
History     CSN: [id number]  Arrival date and time: 12/31/13 2352   First Provider Initiated Contact with Patient 01/01/14 0036      Chief Complaint  Patient presents with  . Pelvic Pain   HPI Ms. MARLENNE RIDGE is a 23 y.o. G1P1001 who presents to MAU today with complaint of suprapubic pain x 2 days. The patient states that she had an IUD placed in November at her PP visit and is concerned that is causing the pain. The pain is in the RLQ and rated at 3/10 now. She denies taking any pain medication prior to arrival. She denies fever, vaginal discharge or change in appetite. She has had occasional nausea without vomiting or diarrhea. She also has occasional spotting.   OB History   Grav Para Term Preterm Abortions TAB SAB Ect Mult Living   1 1 1  0 0 0 0 0 0 1      Past Medical History  Diagnosis Date  . No pertinent past medical history   . Bacterial vaginitis     Past Surgical History  Procedure Laterality Date  . No past surgeries      Family History  Problem Relation Age of Onset  . Hypertension Mother   . Hypertension Maternal Grandmother     History  Substance Use Topics  . Smoking status: Former Smoker -- 0.25 packs/day    Quit date: 12/31/2012  . Smokeless tobacco: Never Used  . Alcohol Use: No     Comment: social    Allergies:  Allergies  Allergen Reactions  . Latex Itching  . Other Hives    mushrooms    No prescriptions prior to admission    Review of Systems  Constitutional: Negative for fever and malaise/fatigue.  Gastrointestinal: Positive for nausea and abdominal pain. Negative for vomiting, diarrhea and constipation.  Genitourinary: Negative for dysuria, urgency and frequency.       Neg - vaginal bleeding + vaginal discharge   Physical Exam   Blood pressure 115/75, pulse 75, temperature 98 F (36.7 C), temperature source Oral, resp. rate 16, not currently breastfeeding.  Physical Exam  Constitutional: She is oriented to person,  place, and time. She appears well-developed and well-nourished. No distress.  HENT:  Head: Normocephalic and atraumatic.  Cardiovascular: Normal rate.   Respiratory: Effort normal.  Genitourinary: Uterus is not enlarged and not tender. Cervix exhibits no motion tenderness, no discharge and no friability. Right adnexum displays tenderness (mild). Right adnexum displays no mass. Left adnexum displays no mass and no tenderness. No bleeding around the vagina. Vaginal discharge (moderate amount of yellow, mucus discharge noted) found.    Neurological: She is alert and oriented to person, place, and time.  Skin: Skin is warm and dry. No erythema.  Psychiatric: She has a normal mood and affect.   Results for orders placed during the hospital encounter of 12/31/13 (from the past 24 hour(s))  URINALYSIS, ROUTINE W REFLEX MICROSCOPIC     Status: Abnormal   Collection Time    01/01/14 12:05 AM      Result Value Ref Range   Color, Urine YELLOW  YELLOW   APPearance CLEAR  CLEAR   Specific Gravity, Urine >1.030 (*) 1.005 - 1.030   pH 6.0  5.0 - 8.0   Glucose, UA NEGATIVE  NEGATIVE mg/dL   Hgb urine dipstick TRACE (*) NEGATIVE   Bilirubin Urine NEGATIVE  NEGATIVE   Ketones, ur NEGATIVE  NEGATIVE mg/dL   Protein, ur NEGATIVE  NEGATIVE mg/dL   Urobilinogen, UA 0.2  0.0 - 1.0 mg/dL   Nitrite NEGATIVE  NEGATIVE   Leukocytes, UA NEGATIVE  NEGATIVE  URINE MICROSCOPIC-ADD ON     Status: Abnormal   Collection Time    01/01/14 12:05 AM      Result Value Ref Range   Squamous Epithelial / LPF FEW (*) RARE   WBC, UA 0-2  <3 WBC/hpf   RBC / HPF 3-6  <3 RBC/hpf   Bacteria, UA FEW (*) RARE  POCT PREGNANCY, URINE     Status: None   Collection Time    01/01/14 12:29 AM      Result Value Ref Range   Preg Test, Ur NEGATIVE  NEGATIVE  WET PREP, GENITAL     Status: Abnormal   Collection Time    01/01/14 12:47 AM      Result Value Ref Range   Yeast Wet Prep HPF POC NONE SEEN  NONE SEEN   Trich, Wet Prep  NONE SEEN  NONE SEEN   Clue Cells Wet Prep HPF POC FEW (*) NONE SEEN   WBC, Wet Prep HPF POC FEW (*) NONE SEEN    MAU Course  Procedures None  MDM UPT - negative UA, wet prep, GC/Chlamydia today  Assessment and Plan  A: Bacterial vaginosis  P: Discharge home Rx for Flagyl sent to patient's pharmacy Discussed hygiene products for avoiding recurrence of BV Patient advised to take Ibuprofen PRN for pain Patient advised to follow-up with [facility] clinic if symptoms persist or worsen Patient may return to MAU as needed or if her condition were to change or worsen  Farris Has, PA-C  01/01/2014, 1:51 AM

## 2014-01-01 NOTE — Discharge Instructions (Signed)

## 2014-01-01 NOTE — MAU Note (Signed)
Pt states she has a MIrena and it is given her pain. Pt state she is having pain on her right side

## 2014-04-28 ENCOUNTER — Encounter (HOSPITAL_COMMUNITY): Payer: Self-pay | Admitting: *Deleted

## 2014-04-28 ENCOUNTER — Inpatient Hospital Stay (HOSPITAL_COMMUNITY)
Admission: AD | Admit: 2014-04-28 | Discharge: 2014-04-28 | Disposition: A | Discharge status: Home / Self Care | Payer: Medicaid Other | Source: Ambulatory Visit | Attending: Obstetrics & Gynecology | Admitting: Obstetrics & Gynecology

## 2014-04-28 DIAGNOSIS — N760 Acute vaginitis: Secondary | ICD-10-CM | POA: Insufficient documentation

## 2014-04-28 DIAGNOSIS — Z87891 Personal history of nicotine dependence: Secondary | ICD-10-CM | POA: Insufficient documentation

## 2014-04-28 DIAGNOSIS — Z8249 Family history of ischemic heart disease and other diseases of the circulatory system: Secondary | ICD-10-CM | POA: Insufficient documentation

## 2014-04-28 DIAGNOSIS — B9689 Other specified bacterial agents as the cause of diseases classified elsewhere: Secondary | ICD-10-CM | POA: Insufficient documentation

## 2014-04-28 DIAGNOSIS — A499 Bacterial infection, unspecified: Secondary | ICD-10-CM

## 2014-04-28 LAB — URINALYSIS, ROUTINE W REFLEX MICROSCOPIC
BILIRUBIN URINE: NEGATIVE
Glucose, UA: NEGATIVE mg/dL
Ketones, ur: NEGATIVE mg/dL
Leukocytes, UA: NEGATIVE
NITRITE: NEGATIVE
PH: 6 (ref 5.0–8.0)
Protein, ur: NEGATIVE mg/dL
Specific Gravity, Urine: 1.02 (ref 1.005–1.030)
UROBILINOGEN UA: 0.2 mg/dL (ref 0.0–1.0)

## 2014-04-28 LAB — URINE MICROSCOPIC-ADD ON

## 2014-04-28 LAB — WET PREP, GENITAL
TRICH WET PREP: NONE SEEN
Yeast Wet Prep HPF POC: NONE SEEN

## 2014-04-28 LAB — POCT PREGNANCY, URINE: Preg Test, Ur: NEGATIVE

## 2014-04-28 MED ORDER — METRONIDAZOLE 500 MG PO TABS: 500.0000 mg | ORAL_TABLET | Freq: Two times a day (BID) | ORAL | Status: DC

## 2014-04-28 NOTE — MAU Provider Note (Signed)
Attestation of Attending Supervision of Advanced Practitioner (CNM/NP): Evaluation and management procedures were performed by the Advanced Practitioner under my supervision and collaboration.  I have reviewed the Advanced Practitioner's note and chart, and I agree with the management and plan.  HARRAWAY-SMITH, Dre Gamino 10:11 PM

## 2014-04-28 NOTE — Discharge Instructions (Signed)
Bacterial Vaginosis Bacterial vaginosis is a vaginal infection that occurs when the normal balance of bacteria in the vagina is disrupted. It results from an overgrowth of certain bacteria. This is the most common vaginal infection in women of childbearing age. Treatment is important to prevent complications, especially in pregnant women, as it can cause a premature delivery. CAUSES  Bacterial vaginosis is caused by an increase in harmful bacteria that are normally present in smaller amounts in the vagina. Several different kinds of bacteria can cause bacterial vaginosis. However, the reason that the condition develops is not fully understood. RISK FACTORS Certain activities or behaviors can put you at an increased risk of developing bacterial vaginosis, including:  Having a new sex partner or multiple sex partners.  Douching.  Using an intrauterine device (IUD) for contraception. Women do not get bacterial vaginosis from toilet seats, bedding, swimming pools, or contact with objects around them. SIGNS AND SYMPTOMS  Some women with bacterial vaginosis have no signs or symptoms. Common symptoms include:  Grey vaginal discharge.  A fishlike odor with discharge, especially after sexual intercourse.  Itching or burning of the vagina and vulva.  Burning or pain with urination. DIAGNOSIS  Your health care provider will take a medical history and examine the vagina for signs of bacterial vaginosis. A sample of vaginal fluid may be taken. Your health care provider will look at this sample under a microscope to check for bacteria and abnormal cells. A vaginal pH test may also be done.  TREATMENT  Bacterial vaginosis may be treated with antibiotic medicines. These may be given in the form of a pill or a vaginal cream. A second round of antibiotics may be prescribed if the condition comes back after treatment.  HOME CARE INSTRUCTIONS   Only take over-the-counter or prescription medicines as  directed by your health care provider.  If antibiotic medicine was prescribed, take it as directed. Make sure you finish it even if you start to feel better.  Do not have sex until treatment is completed.  Tell all sexual partners that you have a vaginal infection. They should see their health care provider and be treated if they have problems, such as a mild rash or itching.  Practice safe sex by using condoms and only having one sex partner. SEEK MEDICAL CARE IF:   Your symptoms are not improving after 3 days of treatment.  You have increased discharge or pain.  You have a fever. MAKE SURE YOU:   Understand these instructions.  Will watch your condition.  Will get help right away if you are not doing well or get worse. FOR MORE INFORMATION  Centers for Disease Control and Prevention, Division of STD Prevention: www.cdc.gov/std American Sexual Health Association (ASHA): www.ashastd.org  Document Released: 10/10/2005 Document Revised: 07/31/2013 Document Reviewed: 05/22/2013 ExitCare Patient Information 2015 [employer]. This information is not intended to replace advice given to you by your health care provider. Make sure you discuss any questions you have with your health care provider.  

## 2014-04-28 NOTE — MAU Note (Signed)
Patient states she had a vaginal delivery in November. States she had a Mirena placed in December. States she has been having abdominal pain off and on from a previous ovarian cyst but has gotten worse in the past month. Thinks the IUD may be causing pain. Has some spotting.

## 2014-04-28 NOTE — MAU Note (Signed)
Pt experiences lower abd cramp at the end of each month with red spotting.  Pt wanted to make sure the spotting and pain was her "period" and not the mirena.

## 2014-04-28 NOTE — MAU Provider Note (Signed)
History     CSN: [id number]  Arrival date and time: 04/28/14 5188   First Provider Initiated Contact with Patient 04/28/14 2019      Chief Complaint  Patient presents with  . Abdominal Pain  . Vaginal Bleeding   HPI  Rebecca Leach is a 23 y.o. G1P1001 who presents today with vaginal discharge and odor. She thinks that she may have BV again. She also states that at the end of every month she gets cramping and a small amount of spotting.   Past Medical History  Diagnosis Date  . No pertinent past medical history   . Bacterial vaginitis     Past Surgical History  Procedure Laterality Date  . No past surgeries      Family History  Problem Relation Age of Onset  . Hypertension Mother   . Hypertension Maternal Grandmother     History  Substance Use Topics  . Smoking status: Former Smoker -- 0.25 packs/day    Quit date: 12/31/2012  . Smokeless tobacco: Never Used  . Alcohol Use: No     Comment: social    Allergies:  Allergies  Allergen Reactions  . Latex Itching  . Other Hives    mushrooms    Prescriptions prior to admission  Medication Sig Dispense Refill  . ibuprofen (ADVIL,MOTRIN) 600 MG tablet Take 1 tablet (600 mg total) by mouth every 6 (six) hours.  30 tablet  0    ROS Physical Exam   Blood pressure 118/48, pulse 61, resp. rate 16, height 5' 7.5" (1.715 m), weight 78.019 kg (172 lb), SpO2 98.00%, not currently breastfeeding.  Physical Exam  Nursing note and vitals reviewed. Constitutional: She is oriented to person, place, and time. She appears well-developed and well-nourished. No distress.  Cardiovascular: Normal rate.   Respiratory: Effort normal.  GI: Soft. There is no tenderness. There is no rebound.  Genitourinary:   External: no lesion Vagina: small amount of white, malodorous discharge Cervix: pink, smooth, no CMT, IUD tail seen at os  Uterus: NSSC Adnexa: NT   Neurological: She is alert and oriented to person, place, and time.   Skin: Skin is warm and dry.  Psychiatric: She has a normal mood and affect.    MAU Course  Procedures  Results for orders placed during the hospital encounter of 04/28/14 (from the past 24 hour(s))  URINALYSIS, ROUTINE W REFLEX MICROSCOPIC     Status: Abnormal   Collection Time    04/28/14  7:10 PM      Result Value Ref Range   Color, Urine YELLOW  YELLOW   APPearance CLEAR  CLEAR   Specific Gravity, Urine 1.020  1.005 - 1.030   pH 6.0  5.0 - 8.0   Glucose, UA NEGATIVE  NEGATIVE mg/dL   Hgb urine dipstick SMALL (*) NEGATIVE   Bilirubin Urine NEGATIVE  NEGATIVE   Ketones, ur NEGATIVE  NEGATIVE mg/dL   Protein, ur NEGATIVE  NEGATIVE mg/dL   Urobilinogen, UA 0.2  0.0 - 1.0 mg/dL   Nitrite NEGATIVE  NEGATIVE   Leukocytes, UA NEGATIVE  NEGATIVE  URINE MICROSCOPIC-ADD ON     Status: Abnormal   Collection Time    04/28/14  7:10 PM      Result Value Ref Range   Squamous Epithelial / LPF FEW (*) RARE   WBC, UA 0-2  <3 WBC/hpf  POCT PREGNANCY, URINE     Status: None   Collection Time    04/28/14  7:29 PM  Result Value Ref Range   Preg Test, Ur NEGATIVE  NEGATIVE  WET PREP, GENITAL     Status: Abnormal   Collection Time    04/28/14  8:30 PM      Result Value Ref Range   Yeast Wet Prep HPF POC NONE SEEN  NONE SEEN   Trich, Wet Prep NONE SEEN  NONE SEEN   Clue Cells Wet Prep HPF POC FEW (*) NONE SEEN   WBC, Wet Prep HPF POC FEW (*) NONE SEEN    Assessment and Plan   1. BV (bacterial vaginosis)      Medication List         ibuprofen 600 MG tablet  Commonly known as:  ADVIL,MOTRIN  Take 1 tablet (600 mg total) by mouth every 6 (six) hours.     metroNIDAZOLE 500 MG tablet  Commonly known as:  FLAGYL  Take 1 tablet (500 mg total) by mouth 2 (two) times daily.       Follow-up Information   Follow up with [facility]. (As needed)    Specialty:  Obstetrics and Gynecology   Contact information:   [place] [address] [phone]        Mathis Bud 04/28/2014, 8:21 PM

## 2014-04-29 LAB — GC/CHLAMYDIA PROBE AMP
CT Probe RNA: NEGATIVE
GC Probe RNA: NEGATIVE

## 2014-08-25 ENCOUNTER — Encounter (HOSPITAL_COMMUNITY): Payer: Self-pay | Admitting: *Deleted

## 2015-08-19 ENCOUNTER — Ambulatory Visit: Payer: Medicaid Other | Admitting: Obstetrics and Gynecology

## 2015-08-21 ENCOUNTER — Encounter: Payer: Self-pay | Admitting: *Deleted

## 2015-08-28 ENCOUNTER — Ambulatory Visit: Payer: Medicaid Other | Admitting: Obstetrics & Gynecology

## 2015-08-28 ENCOUNTER — Encounter (HOSPITAL_COMMUNITY): Payer: Self-pay | Admitting: *Deleted

## 2015-08-28 ENCOUNTER — Inpatient Hospital Stay (HOSPITAL_COMMUNITY)
Admission: AD | Admit: 2015-08-28 | Discharge: 2015-08-28 | Disposition: A | Discharge status: Home / Self Care | Payer: Medicaid Other | Source: Ambulatory Visit | Attending: Obstetrics & Gynecology | Admitting: Obstetrics & Gynecology

## 2015-08-28 DIAGNOSIS — R1030 Lower abdominal pain, unspecified: Secondary | ICD-10-CM

## 2015-08-28 DIAGNOSIS — A499 Bacterial infection, unspecified: Secondary | ICD-10-CM

## 2015-08-28 DIAGNOSIS — B9689 Other specified bacterial agents as the cause of diseases classified elsewhere: Secondary | ICD-10-CM

## 2015-08-28 DIAGNOSIS — N760 Acute vaginitis: Secondary | ICD-10-CM | POA: Diagnosis not present

## 2015-08-28 DIAGNOSIS — Z30431 Encounter for routine checking of intrauterine contraceptive device: Secondary | ICD-10-CM

## 2015-08-28 DIAGNOSIS — R1031 Right lower quadrant pain: Secondary | ICD-10-CM | POA: Diagnosis not present

## 2015-08-28 LAB — WET PREP, GENITAL
TRICH WET PREP: NONE SEEN
YEAST WET PREP: NONE SEEN

## 2015-08-28 NOTE — MAU Provider Note (Signed)
History     CSN: [id number]  Arrival date and time: 08/28/15 1507   First Provider Initiated Contact with Patient 08/28/15 1612      Chief Complaint  Patient presents with  . Unable to find IUD string    HPI  Ms. Rebecca Leach is a 24 y.o. G94P1001 female who presents to MAU today with complaint of vaginal discharge for which she contacted her "PCP" at [facility] today- she was prescribed Metrogel based on her description of complaints.  She has a Mirena that was placed 2 years ago, and has been having intermittent RLQ pain since then that is controlled by OTC ibuprofen. She was instructed by the urgent care to feel for her IUD strings and was unable to feel these and was instructed to follow up at an ED immediately. The patient has never previously monitored the presence of the IUD strings before. The patient denies recent onset or change in abdominal pain, denies recent vaginal bleeding.   OB History    Gravida Para Term Preterm AB TAB SAB Ectopic Multiple Living   1 1 1  0 0 0 0 0 0 1      Past Medical History  Diagnosis Date  . No pertinent past medical history   . Bacterial vaginitis     Past Surgical History  Procedure Laterality Date  . No past surgeries      Family History  Problem Relation Age of Onset  . Hypertension Mother   . Hypertension Maternal Grandmother     Social History  Substance Use Topics  . Smoking status: Former Smoker -- 0.25 packs/day    Quit date: 12/31/2012  . Smokeless tobacco: Never Used  . Alcohol Use: No     Comment: social    Allergies:  Allergies  Allergen Reactions  . Latex Itching  . Other Hives    mushrooms    Prescriptions prior to admission  Medication Sig Dispense Refill Last Dose  . Lactobacillus (ACIDOPHILUS PO) Take 1 capsule by mouth daily.   Past Month at Unknown time  . ibuprofen (ADVIL,MOTRIN) 600 MG tablet Take 1 tablet (600 mg total) by mouth every 6 (six) hours. (Patient not taking: Reported on  08/28/2015) 30 tablet 0 04/28/2014 at 1400  . metroNIDAZOLE (FLAGYL) 500 MG tablet Take 1 tablet (500 mg total) by mouth 2 (two) times daily. (Patient not taking: Reported on 08/28/2015) 14 tablet 0     Review of Systems  Constitutional: Negative for fever and chills.  Respiratory: Negative for shortness of breath.   Gastrointestinal: Positive for abdominal pain (Mild intermittent RLQ pain x 2 years; no acute change today).  Genitourinary: Negative for dysuria, urgency, frequency and hematuria.       Positive vaginal discharge.   Skin: Negative for itching.  All other systems reviewed and are negative.  Physical Exam   Blood pressure 113/60, pulse 70, temperature 98.2 F (36.8 C), temperature source Oral, resp. rate 16, height 5\' 8"  (1.727 m), weight 174 lb 4 oz (79.039 kg), last menstrual period 08/11/2015, not currently breastfeeding.  Physical Exam  Constitutional: She is oriented to person, place, and time. She appears well-developed and well-nourished. No distress.  HENT:  Head: Normocephalic and atraumatic.  Neck: Normal range of motion. Neck supple.  Cardiovascular: Normal rate.   Respiratory: Effort normal and breath sounds normal. No respiratory distress.  GI: Soft. She exhibits no distension. There is no tenderness. There is no guarding.  Genitourinary: Uterus normal. There is no  rash or lesion on the right labia. There is no rash or lesion on the left labia. Uterus is not enlarged and not tender. Cervix exhibits discharge and friability (scattered petichial spots, scant bleeding post swab). Cervix exhibits no motion tenderness. Right adnexum displays no mass and no tenderness. Left adnexum displays no mass and no tenderness. Vaginal discharge (Moderate amound of white mucous cervical discharge) found.  IUD strings were not visualized or palpable today  Musculoskeletal: Normal range of motion.  Neurological: She is alert and oriented to person, place, and time.  Skin: Skin is warm  and dry. She is not diaphoretic.  Psychiatric: She has a normal mood and affect. Her behavior is normal.   Results for orders placed or performed during the hospital encounter of 08/28/15 (from the past 24 hour(s))  Wet prep, genital     Status: Abnormal   Collection Time: 08/28/15  4:01 PM  Result Value Ref Range   Yeast Wet Prep HPF POC NONE SEEN NONE SEEN   Trich, Wet Prep NONE SEEN NONE SEEN   Clue Cells Wet Prep HPF POC MANY (A) NONE SEEN   WBC, Wet Prep HPF POC FEW (A) NONE SEEN    MAU Course  Procedures None  MDM Wet prep and GC/Chlamydia today Pain is unchanged x 2 years and no bleeding today. Will evaluate for IUD placement next week as outpatient.  Assessment and Plan  A: IUD check, strings not visualized Bacterial vaginosis  P: Discharge home Patient advised to take Metrogel recently prescribed by PCP Warning signs for worsening condition discussed Outpatient [place] scheduled for 09/02/15 at 8:30 am to evaluate IUD placement Patient advised to follow-up with PCP as needed for routine care or sooner PRN Patient may return to MAU as needed or if her condition were to change or worsen   Luvenia Redden, PA-C  08/28/2015, 4:12 PM

## 2015-08-28 NOTE — MAU Note (Signed)
Patient presents stating she is not pregnant with c/o being unable to locate IUD string. Denies bleeding but states she does have pain and discharge.

## 2015-08-28 NOTE — Discharge Instructions (Signed)
Bacterial Vaginosis Bacterial vaginosis is an infection of the vagina. It happens when too many germs (bacteria) grow in the vagina. Having this infection puts you at risk for getting other infections from sex. Treating this infection can help lower your risk for other infections, such as:   Chlamydia.  Gonorrhea.  HIV.  Herpes. HOME CARE  Take your medicine as told by your doctor.  Finish your medicine even if you start to feel better.  Tell your sex partner that you have an infection. They should see their doctor for treatment.  During treatment:  Avoid sex or use condoms correctly.  Do not douche.  Do not drink alcohol unless your doctor tells you it is ok.  Do not breastfeed unless your doctor tells you it is ok. GET HELP IF:  You are not getting better after 3 days of treatment.  You have more grey fluid (discharge) coming from your vagina than before.  You have more pain than before.  You have a fever. MAKE SURE YOU:   Understand these instructions.  Will watch your condition.  Will get help right away if you are not doing well or get worse.   This information is not intended to replace advice given to you by your health care provider. Make sure you discuss any questions you have with your health care provider.   Document Released: 07/19/2008 Document Revised: 10/31/2014 Document Reviewed: 05/22/2013 Elsevier Interactive Patient Education 2016 [employer]. Intrauterine Device Information An intrauterine device (IUD) is inserted into your uterus to prevent pregnancy. There are two types of IUDs available:   Copper IUD--This type of IUD is wrapped in copper wire and is placed inside the uterus. Copper makes the uterus and fallopian tubes produce a fluid that kills sperm. The copper IUD can stay in place for 10 years.  Hormone IUD--This type of IUD contains the hormone progestin (synthetic progesterone). The hormone thickens the cervical mucus and prevents  sperm from entering the uterus. It also thins the uterine lining to prevent implantation of a fertilized egg. The hormone can weaken or kill the sperm that get into the uterus. One type of hormone IUD can stay in place for 5 years, and another type can stay in place for 3 years. Your health care provider will make sure you are a good candidate for a contraceptive IUD. Discuss with your health care provider the possible side effects.  ADVANTAGES OF AN INTRAUTERINE DEVICE  IUDs are highly effective, reversible, long acting, and low maintenance.   There are no estrogen-related side effects.   An IUD can be used when breastfeeding.   IUDs are not associated with weight gain.   The copper IUD works immediately after insertion.   The hormone IUD works right away if inserted within 7 days of your period starting. You will need to use a backup method of birth control for 7 days if the hormone IUD is inserted at any other time in your cycle.  The copper IUD does not interfere with your female hormones.   The hormone IUD can make heavy menstrual periods lighter and decrease cramping.   The hormone IUD can be used for 3 or 5 years.   The copper IUD can be used for 10 years. DISADVANTAGES OF AN INTRAUTERINE DEVICE  The hormone IUD can be associated with irregular bleeding patterns.   The copper IUD can make your menstrual flow heavier and more painful.   You may experience cramping and vaginal bleeding after insertion.  This information is not intended to replace advice given to you by your health care provider. Make sure you discuss any questions you have with your health care provider.   Document Released: 09/13/2004 Document Revised: 06/12/2013 Document Reviewed: 03/31/2013 Elsevier Interactive Patient Education [employer].

## 2015-08-31 LAB — GC/CHLAMYDIA PROBE AMP (~~LOC~~) NOT AT ARMC
CHLAMYDIA, DNA PROBE: NEGATIVE
NEISSERIA GONORRHEA: NEGATIVE

## 2015-09-02 ENCOUNTER — Ambulatory Visit (HOSPITAL_COMMUNITY): Admit: 2015-09-02 | Payer: Medicaid Other

## 2015-09-13 ENCOUNTER — Inpatient Hospital Stay (HOSPITAL_COMMUNITY)
Admission: AD | Admit: 2015-09-13 | Discharge: 2015-09-13 | Disposition: A | Discharge status: Home / Self Care | Payer: Medicaid Other | Source: Ambulatory Visit | Attending: Obstetrics & Gynecology | Admitting: Obstetrics & Gynecology

## 2015-09-13 ENCOUNTER — Inpatient Hospital Stay (HOSPITAL_COMMUNITY): Payer: Medicaid Other

## 2015-09-13 ENCOUNTER — Encounter (HOSPITAL_COMMUNITY): Payer: Self-pay | Admitting: *Deleted

## 2015-09-13 DIAGNOSIS — Z975 Presence of (intrauterine) contraceptive device: Secondary | ICD-10-CM | POA: Insufficient documentation

## 2015-09-13 DIAGNOSIS — R1031 Right lower quadrant pain: Secondary | ICD-10-CM | POA: Insufficient documentation

## 2015-09-13 DIAGNOSIS — D271 Benign neoplasm of left ovary: Secondary | ICD-10-CM | POA: Insufficient documentation

## 2015-09-13 LAB — CBC WITH DIFFERENTIAL/PLATELET
Basophils Absolute: 0 10*3/uL (ref 0.0–0.1)
Basophils Relative: 0 %
EOS PCT: 2 %
Eosinophils Absolute: 0.1 10*3/uL (ref 0.0–0.7)
HCT: 32.9 % — ABNORMAL LOW (ref 36.0–46.0)
Hemoglobin: 11.2 g/dL — ABNORMAL LOW (ref 12.0–15.0)
LYMPHS ABS: 2.3 10*3/uL (ref 0.7–4.0)
LYMPHS PCT: 37 %
MCH: 29.6 pg (ref 26.0–34.0)
MCHC: 34 g/dL (ref 30.0–36.0)
MCV: 86.8 fL (ref 78.0–100.0)
MONO ABS: 0.3 10*3/uL (ref 0.1–1.0)
Monocytes Relative: 5 %
Neutro Abs: 3.4 10*3/uL (ref 1.7–7.7)
Neutrophils Relative %: 56 %
PLATELETS: 310 10*3/uL (ref 150–400)
RBC: 3.79 MIL/uL — ABNORMAL LOW (ref 3.87–5.11)
RDW: 12.4 % (ref 11.5–15.5)
WBC: 6.1 10*3/uL (ref 4.0–10.5)

## 2015-09-13 LAB — POCT PREGNANCY, URINE: Preg Test, Ur: NEGATIVE

## 2015-09-13 LAB — URINALYSIS, ROUTINE W REFLEX MICROSCOPIC
BILIRUBIN URINE: NEGATIVE
Glucose, UA: NEGATIVE mg/dL
HGB URINE DIPSTICK: NEGATIVE
Ketones, ur: NEGATIVE mg/dL
Leukocytes, UA: NEGATIVE
Nitrite: NEGATIVE
PH: 5.5 (ref 5.0–8.0)
Protein, ur: NEGATIVE mg/dL

## 2015-09-13 NOTE — MAU Note (Addendum)
RLQ pain x 2 days, denies bleeding or discharge.  No dysuria.  Pain shoots down her R leg.  Pt states she was scheduled for U/S 2 weeks ago but couldn't come because of her new job.

## 2015-09-13 NOTE — Discharge Instructions (Signed)
Ovarian Cyst An ovarian cyst is a fluid-filled sac that forms on an ovary. The ovaries are small organs that produce eggs in women. Various types of cysts can form on the ovaries. Most are not cancerous. Many do not cause problems, and they often go away on their own. Some may cause symptoms and require treatment. Common types of ovarian cysts include:  Functional cysts--These cysts may occur every month during the menstrual cycle. This is normal. The cysts usually go away with the next menstrual cycle if the woman does not get pregnant. Usually, there are no symptoms with a functional cyst.  Endometrioma cysts--These cysts form from the tissue that lines the uterus. They are also called "chocolate cysts" because they become filled with blood that turns brown. This type of cyst can cause pain in the lower abdomen during intercourse and with your menstrual period.  Cystadenoma cysts--This type develops from the cells on the outside of the ovary. These cysts can get very big and cause lower abdomen pain and pain with intercourse. This type of cyst can twist on itself, cut off its blood supply, and cause severe pain. It can also easily rupture and cause a lot of pain.  Dermoid cysts--This type of cyst is sometimes found in both ovaries. These cysts may contain different kinds of body tissue, such as skin, teeth, hair, or cartilage. They usually do not cause symptoms unless they get very big.  Theca lutein cysts--These cysts occur when too much of a certain hormone (human chorionic gonadotropin) is produced and overstimulates the ovaries to produce an egg. This is most common after procedures used to assist with the conception of a baby (in vitro fertilization). CAUSES   Fertility drugs can cause a condition in which multiple large cysts are formed on the ovaries. This is called ovarian hyperstimulation syndrome.  A condition called polycystic ovary syndrome can cause hormonal imbalances that can lead to  nonfunctional ovarian cysts. SIGNS AND SYMPTOMS  Many ovarian cysts do not cause symptoms. If symptoms are present, they may include:  Pelvic pain or pressure.  Pain in the lower abdomen.  Pain during sexual intercourse.  Increasing girth (swelling) of the abdomen.  Abnormal menstrual periods.  Increasing pain with menstrual periods.  Stopping having menstrual periods without being pregnant. DIAGNOSIS  These cysts are commonly found during a routine or annual pelvic exam. Tests may be ordered to find out more about the cyst. These tests may include:  Ultrasound.  X-ray of the pelvis.  CT scan.  MRI.  Blood tests. TREATMENT  Many ovarian cysts go away on their own without treatment. Your health care provider may want to check your cyst regularly for 2-3 months to see if it changes. For women in menopause, it is particularly important to monitor a cyst closely because of the higher rate of ovarian cancer in menopausal women. When treatment is needed, it may include any of the following:  A procedure to drain the cyst (aspiration). This may be done using a long needle and ultrasound. It can also be done through a laparoscopic procedure. This involves using a thin, lighted tube with a tiny camera on the end (laparoscope) inserted through a small incision.  Surgery to remove the whole cyst. This may be done using laparoscopic surgery or an open surgery involving a larger incision in the lower abdomen.  Hormone treatment or birth control pills. These methods are sometimes used to help dissolve a cyst. HOME CARE INSTRUCTIONS   Only take over-the-counter   or prescription medicines as directed by your health care provider.  Follow up with your health care provider as directed.  Get regular pelvic exams and Pap tests. SEEK MEDICAL CARE IF:   Your periods are late, irregular, or painful, or they stop.  Your pelvic pain or abdominal pain does not go away.  Your abdomen becomes  larger or swollen.  You have pressure on your bladder or trouble emptying your bladder completely.  You have pain during sexual intercourse.  You have feelings of fullness, pressure, or discomfort in your stomach.  You lose weight for no apparent reason.  You feel generally ill.  You become constipated.  You lose your appetite.  You develop acne.  You have an increase in body and facial hair.  You are gaining weight, without changing your exercise and eating habits.  You think you are pregnant. SEEK IMMEDIATE MEDICAL CARE IF:   You have increasing abdominal pain.  You feel sick to your stomach (nauseous), and you throw up (vomit).  You develop a fever that comes on suddenly.  You have abdominal pain during a bowel movement.  Your menstrual periods become heavier than usual. MAKE SURE YOU:  Understand these instructions.  Will watch your condition.  Will get help right away if you are not doing well or get worse.   This information is not intended to replace advice given to you by your health care provider. Make sure you discuss any questions you have with your health care provider.   Document Released: 10/10/2005 Document Revised: 10/15/2013 Document Reviewed: 06/17/2013 Elsevier Interactive Patient Education 2016 Elsevier Inc[employer]  

## 2015-09-13 NOTE — MAU Provider Note (Signed)
Chief Complaint:  Abdominal Pain   Rebecca Leach is a 24 y.o. G1P1001 who presents to maternity admissions reporting Right lower quadrant pain for 2 days. Was seen on 08/28/15 for a missing IUD string. An [place] was ordered but the patient No-Showed "because of work", did not call. Denies other symptoms. She reports No vaginal bleeding, vaginal itching/burning, urinary symptoms, h/a, dizziness, n/v, or fever/chills.     Abdominal Pain This is a recurrent problem. The current episode started in the past 7 days. The onset quality is gradual. The problem occurs constantly. The problem has been unchanged. The pain is located in the RLQ. The pain is moderate. The quality of the pain is cramping and sharp. The abdominal pain does not radiate. Pertinent negatives include no anorexia, constipation, diarrhea, dysuria, fever, frequency, headaches, myalgias, nausea or vomiting. Nothing aggravates the pain. The pain is relieved by nothing. She has tried nothing for the symptoms. Prior diagnostic workup includes ultrasound (Did not show up for [place]).   RN Note: RLQ pain x 2 days, denies bleeding or discharge. No dysuria. Pain shoots down her R leg. Pt states she was scheduled for U/S 2 weeks ago but couldn't come because of her new job.  Past Medical History: Past Medical History  Diagnosis Date  . No pertinent past medical history   . Bacterial vaginitis     Past obstetric history: OB History  Gravida Para Term Preterm AB SAB TAB Ectopic Multiple Living  1 1 1  0 0 0 0 0 0 1    # Outcome Date GA Lbr Len/2nd Weight Sex Delivery Anes PTL Lv  1 Term 09/16/13 [redacted]w[redacted]d 178:24 / 00:07 6 lb 8.6 oz (2.965 kg) F Vag-Spont EPI  Y      Past Surgical History: Past Surgical History  Procedure Laterality Date  . No past surgeries      Family History: Family History  Problem Relation Age of Onset  . Hypertension Mother   . Hypertension Maternal Grandmother     Social History: Social History  Substance  Use Topics  . Smoking status: Former Smoker -- 0.25 packs/day    Quit date: 12/31/2012  . Smokeless tobacco: Never Used  . Alcohol Use: No     Comment: social    Allergies:  Allergies  Allergen Reactions  . Latex Itching  . Other Hives    mushrooms    Meds:  Prescriptions prior to admission  Medication Sig Dispense Refill Last Dose  . metroNIDAZOLE (METROGEL) 0.75 % vaginal gel Place 1 Applicatorful vaginally 2 (two) times daily.   09/12/2015 at Unknown time  . ibuprofen (ADVIL,MOTRIN) 600 MG tablet Take 1 tablet (600 mg total) by mouth every 6 (six) hours. (Patient not taking: Reported on 08/28/2015) 30 tablet 0 04/28/2014 at 1400  . metroNIDAZOLE (FLAGYL) 500 MG tablet Take 1 tablet (500 mg total) by mouth 2 (two) times daily. (Patient not taking: Reported on 08/28/2015) 14 tablet 0     ROS:  Review of Systems  Constitutional: Negative for fever and chills.  Gastrointestinal: Positive for abdominal pain. Negative for nausea, vomiting, diarrhea, constipation and anorexia.  Genitourinary: Positive for pelvic pain (RLQ). Negative for dysuria, frequency, flank pain and menstrual problem.  Musculoskeletal: Negative for myalgias and back pain.  Neurological: Negative for headaches.   I have reviewed patient's Past Medical Hx, Surgical Hx, Family Hx, Social Hx, medications and allergies.   Physical Exam  Patient Vitals for the past 24 hrs:  BP Temp Temp src Pulse  Resp Height Weight  09/13/15 0915 - - - - - 5\' 8"  (1.727 m) 174 lb 4 oz (79.039 kg)  09/13/15 0902 109/63 mmHg 98.3 F (36.8 C) Oral 63 18 - -   Constitutional: Well-developed, well-nourished female in no acute distress.  Cardiovascular: normal rate and rhythm, no ectopy audible, S1 & S2 heard, no murmur Respiratory: normal effort, no distress. Lungs CTAB with no wheezes or crackles GI: Abd soft, non-tender except for mild tenderness over RLQ.  Nondistended.  No rebound, No guarding.  Bowel Sounds audible  MS: Extremities  nontender, no edema, normal ROM Neurologic: Alert and oriented x 4.   Grossly nonfocal. GU: Neg CVAT. Skin:  Warm and Dry Psych:  Affect appropriate.  PELVIC EXAM:  Cervix firm, anterior, neg CMT, uterus nontender, nonenlarged, adnexa without tenderness on left, mildly tender on right, enlargement, or mass    Labs: Results for orders placed or performed during the hospital encounter of 09/13/15 (from the past 24 hour(s))  Urinalysis, Routine w reflex microscopic (not at [facility])     Status: Abnormal   Collection Time: 09/13/15  9:19 AM  Result Value Ref Range   Color, Urine YELLOW YELLOW   APPearance CLEAR CLEAR   Specific Gravity, Urine >1.030 (H) 1.005 - 1.030   pH 5.5 5.0 - 8.0   Glucose, UA NEGATIVE NEGATIVE mg/dL   Hgb urine dipstick NEGATIVE NEGATIVE   Bilirubin Urine NEGATIVE NEGATIVE   Ketones, ur NEGATIVE NEGATIVE mg/dL   Protein, ur NEGATIVE NEGATIVE mg/dL   Nitrite NEGATIVE NEGATIVE   Leukocytes, UA NEGATIVE NEGATIVE  Pregnancy, urine POC     Status: None   Collection Time: 09/13/15  9:24 AM  Result Value Ref Range   Preg Test, Ur NEGATIVE NEGATIVE  CBC with Differential     Status: Abnormal   Collection Time: 09/13/15  9:43 AM  Result Value Ref Range   WBC 6.1 4.0 - 10.5 K/uL   RBC 3.79 (L) 3.87 - 5.11 MIL/uL   Hemoglobin 11.2 (L) 12.0 - 15.0 g/dL   HCT 32.9 (L) 36.0 - 46.0 %   MCV 86.8 78.0 - 100.0 fL   MCH 29.6 26.0 - 34.0 pg   MCHC 34.0 30.0 - 36.0 g/dL   RDW 12.4 11.5 - 15.5 %   Platelets 310 150 - 400 K/uL   Neutrophils Relative % 56 %   Neutro Abs 3.4 1.7 - 7.7 K/uL   Lymphocytes Relative 37 %   Lymphs Abs 2.3 0.7 - 4.0 K/uL   Monocytes Relative 5 %   Monocytes Absolute 0.3 0.1 - 1.0 K/uL   Eosinophils Relative 2 %   Eosinophils Absolute 0.1 0.0 - 0.7 K/uL   Basophils Relative 0 %   Basophils Absolute 0.0 0.0 - 0.1 K/uL      Imaging:  US Transvaginal Non-ob  09/13/2015  CLINICAL DATA:  Patient with right lower quadrant pain. Evaluate  intrauterine device. EXAM: TRANSABDOMINAL AND TRANSVAGINAL ULTRASOUND OF PELVIS TECHNIQUE: Both transabdominal and transvaginal ultrasound examinations of the pelvis were performed. Transabdominal technique was performed for global imaging of the pelvis including uterus, ovaries, adnexal regions, and pelvic cul-de-sac. It was necessary to proceed with endovaginal exam following the transabdominal exam to visualize the endometrium and adnexal structures. COMPARISON:  Pelvic ultrasound 07/11/2013. FINDINGS: Uterus Measurements: 8.4 x 4.3 x 5.2 cm. No fibroids or other mass visualized. Endometrium Thickness: 7 mm. Intrauterine device is present and appears in appropriate position. Right ovary Measurements: 4.8 x 2.1 x 2.9 cm. Normal appearance/no  adnexal mass. Left ovary Re- demonstrated echogenic left adnexal mass measuring 5.8 x 4.8 x 5.2 cm, most compatible with dermoid. Other findings No free fluid. IMPRESSION: Intrauterine device is present and appears in appropriate position. Unchanged left ovarian dermoid. Electronically Signed   By: Lovey Newcomer M.D.   On: 09/13/2015 10:59   US Pelvis Complete  09/13/2015  CLINICAL DATA:  Patient with right lower quadrant pain. Evaluate intrauterine device. EXAM: TRANSABDOMINAL AND TRANSVAGINAL ULTRASOUND OF PELVIS TECHNIQUE: Both transabdominal and transvaginal ultrasound examinations of the pelvis were performed. Transabdominal technique was performed for global imaging of the pelvis including uterus, ovaries, adnexal regions, and pelvic cul-de-sac. It was necessary to proceed with endovaginal exam following the transabdominal exam to visualize the endometrium and adnexal structures. COMPARISON:  Pelvic ultrasound 07/11/2013. FINDINGS: Uterus Measurements: 8.4 x 4.3 x 5.2 cm. No fibroids or other mass visualized. Endometrium Thickness: 7 mm. Intrauterine device is present and appears in appropriate position. Right ovary Measurements: 4.8 x 2.1 x 2.9 cm. Normal  appearance/no adnexal mass. Left ovary Re- demonstrated echogenic left adnexal mass measuring 5.8 x 4.8 x 5.2 cm, most compatible with dermoid. Other findings No free fluid. IMPRESSION: Intrauterine device is present and appears in appropriate position. Unchanged left ovarian dermoid. Electronically Signed   By: Lovey Newcomer M.D.   On: 09/13/2015 10:59    MAU Course/MDM: I have ordered labs as follows: UA (to rule out UTI), CBC (to rule out appendicitis) Imaging ordered: ultrasound, to view IUD and see if there are any ovarian cysts Results reviewed.   Consult Dr Harolyn Rutherford Treatments in MAU included none   Pt stable at time of discharge.  Assessment: 1. Abdominal pain, right lower quadrant   2.      Dermoid Cyst, left 3.      IUD in place  Plan: Discharge home Recommend supportive care at home, Tylenol for pain Referred to clinic to discuss removing Dermoid in future     Medication List    ASK your doctor about these medications        ibuprofen 600 MG tablet  Commonly known as:  ADVIL,MOTRIN  Take 1 tablet (600 mg total) by mouth every 6 (six) hours.     metroNIDAZOLE 0.75 % vaginal gel  Commonly known as:  METROGEL  Place 1 Applicatorful vaginally 2 (two) times daily.     metroNIDAZOLE 500 MG tablet  Commonly known as:  FLAGYL  Take 1 tablet (500 mg total) by mouth 2 (two) times daily.       Encouraged to return here or to other Urgent Care/ED if she develops worsening of symptoms, increase in pain, fever, or other concerning symptoms.   Hansel Feinstein CNM, MSN Certified Nurse-Midwife 09/13/2015 11:07 AM

## 2016-06-12 IMAGING — US US TRANSVAGINAL NON-OB
1 series · 15 of 25 positions shown · non-contrast
Comparison: Pelvic ultrasound 07/11/2013.

CLINICAL DATA: Patient with right lower quadrant pain. Evaluate
intrauterine device.

EXAM:
TRANSABDOMINAL AND TRANSVAGINAL ULTRASOUND OF PELVIS
TECHNIQUE: Both transabdominal and transvaginal ultrasound examinations of the
pelvis were performed. Transabdominal technique was performed for
global imaging of the pelvis including uterus, ovaries, adnexal
regions, and pelvic cul-de-sac. It was necessary to proceed with
endovaginal exam following the transabdominal exam to visualize the
endometrium and adnexal structures.

[Series 1: us transvaginal non-ob · 15 of 65 slices shown]
[im 1/65]
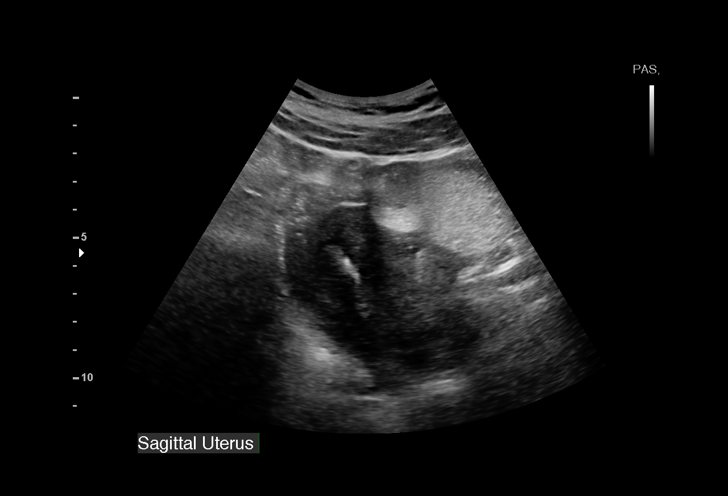
[im 6/65]
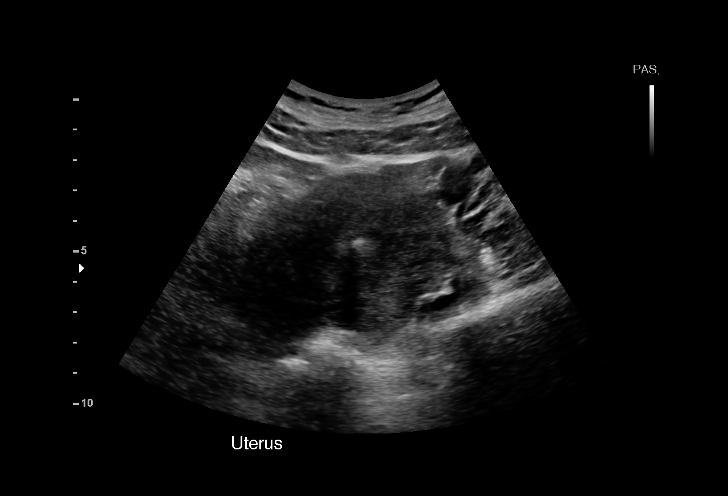
[im 11/65]
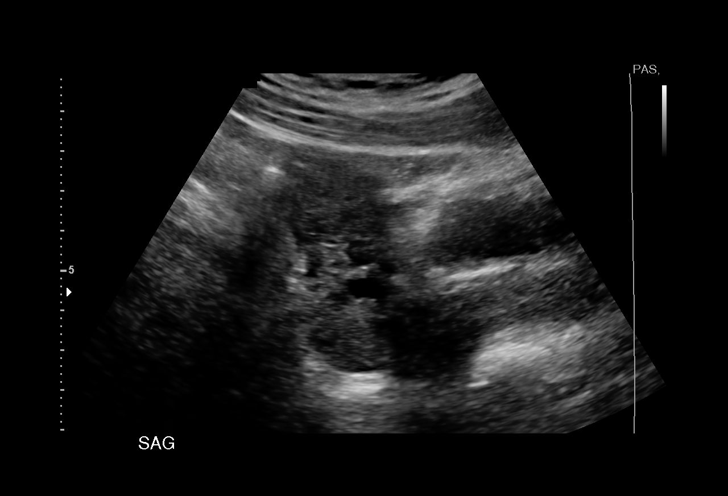
[im 14/65]
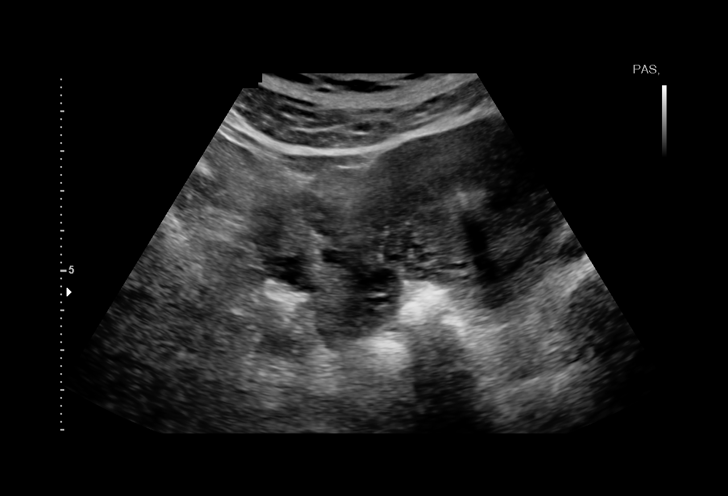
[im 19/65]
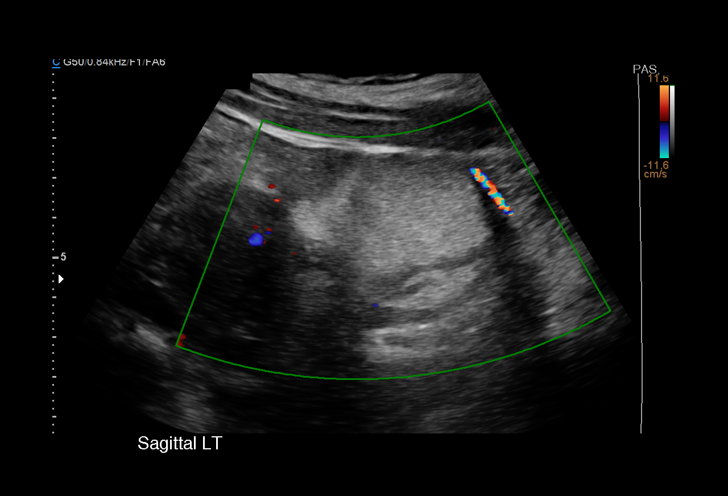
[im 25/65]
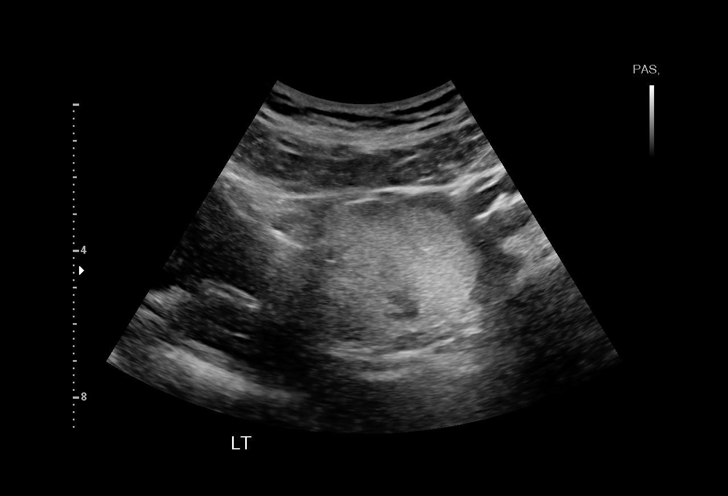
[im 27/65]
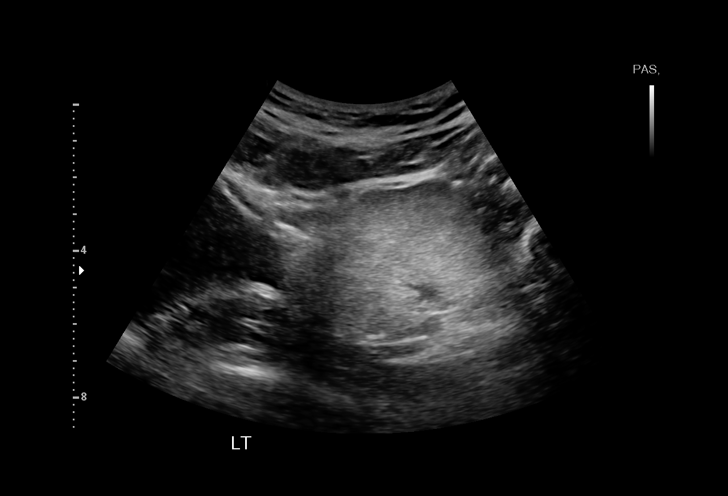
[im 33/65]
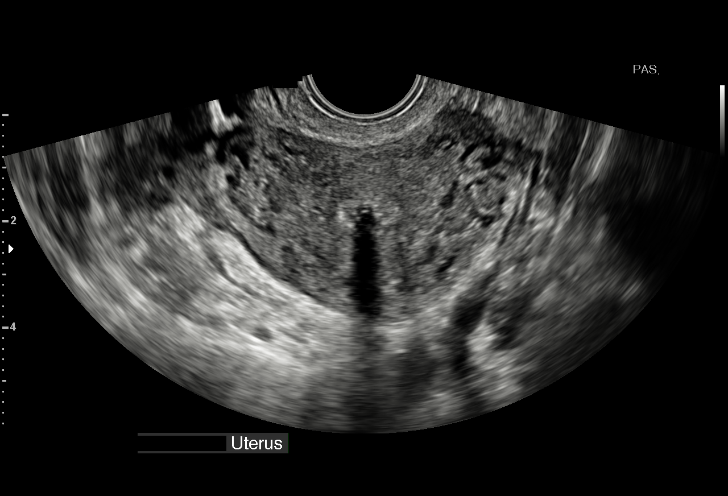
[im 38/65]
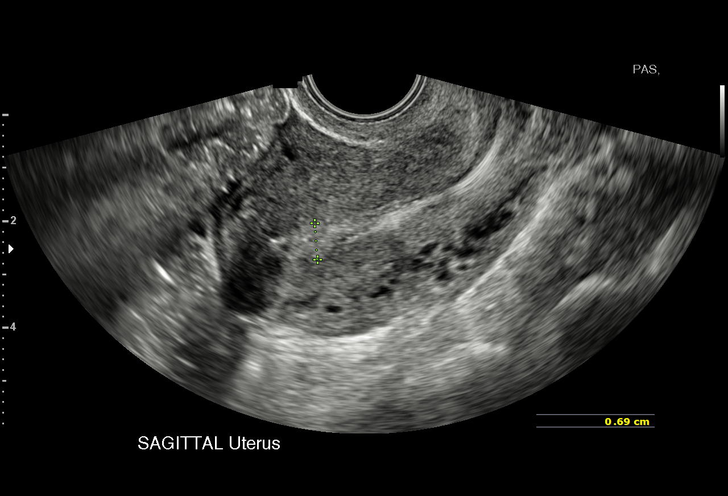
[im 41/65]
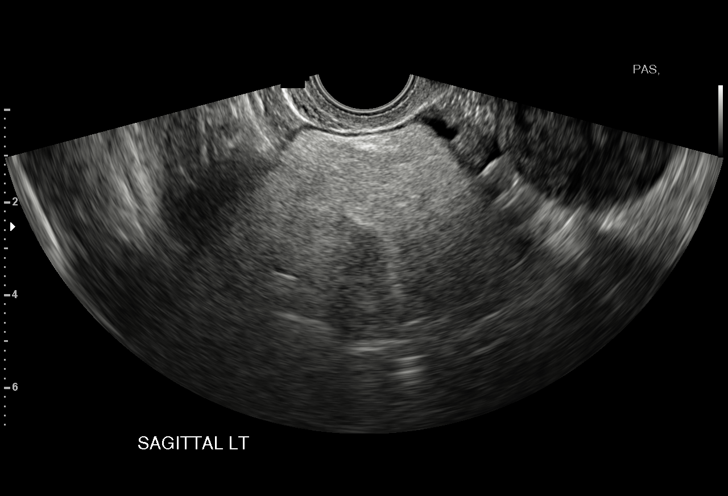
[im 46/65]
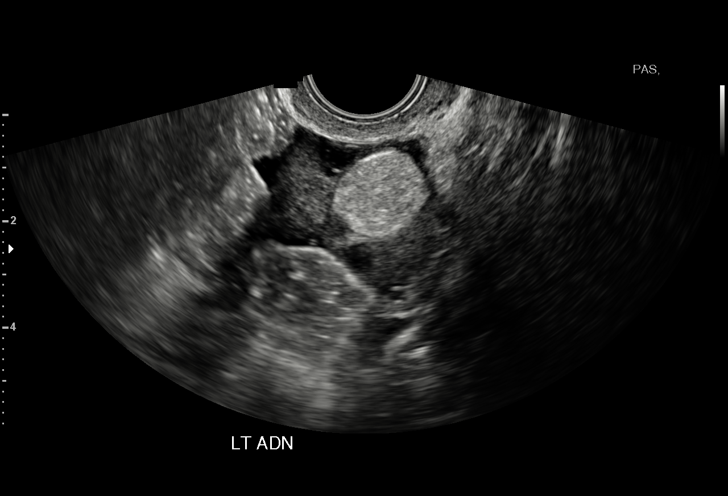
[im 51/65]
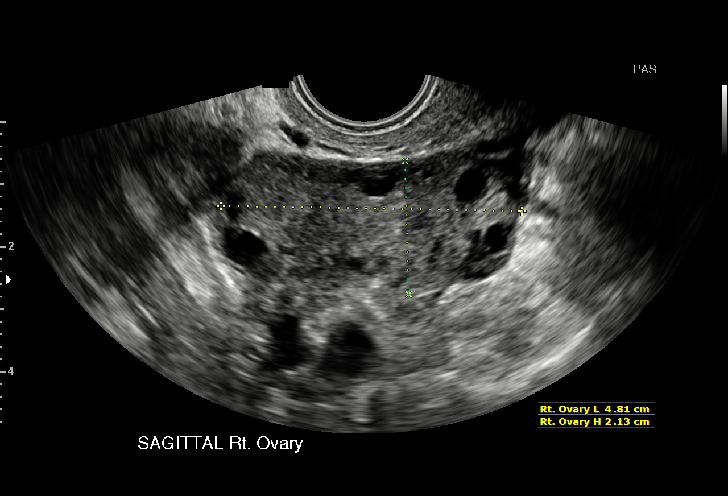
[im 54/65]
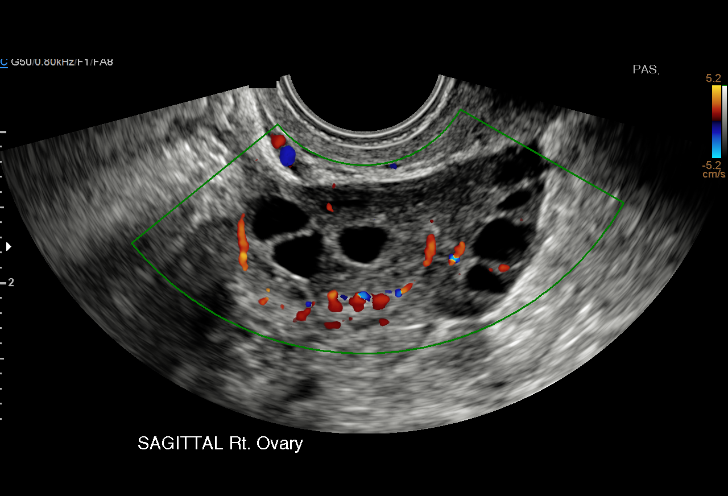
[im 59/65]
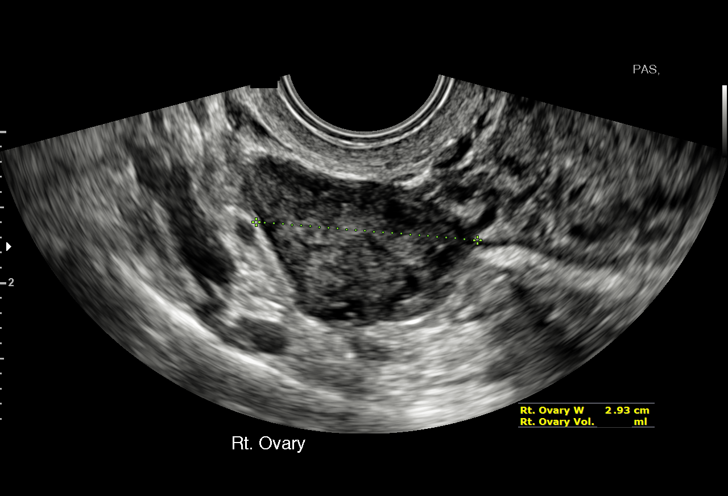
[im 65/65]
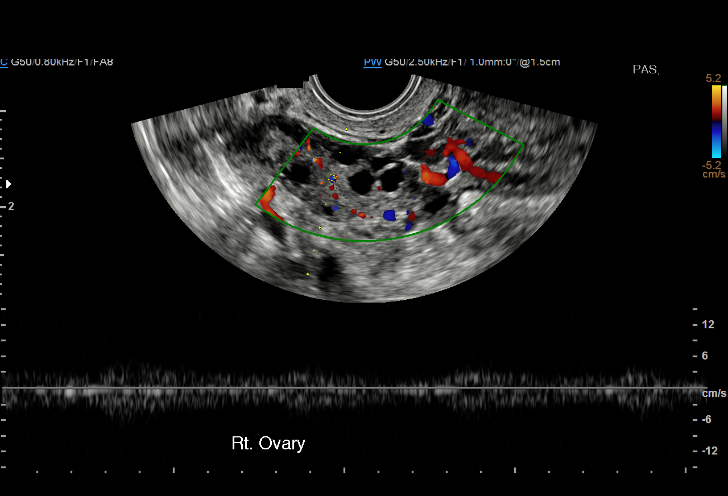

[15 of 25 positions shown; findings below may reference images not displayed]

FINDINGS: Uterus

Measurements: 8.4 x 4.3 x 5.2 cm. No fibroids or other mass
visualized.

Endometrium

Thickness: 7 mm. Intrauterine device is present and appears in
appropriate position.

Right ovary

Measurements: 4.8 x 2.1 x 2.9 cm. Normal appearance/no adnexal mass.

Left ovary

Re- demonstrated echogenic left adnexal mass measuring 5.8 x 4.8 x
5.2 cm, most compatible with dermoid.

Other findings

No free fluid.
IMPRESSION: Intrauterine device is present and appears in appropriate position.

Unchanged left ovarian dermoid.
